# Patient Record
Sex: Female | Born: 1990 | Race: White | Hispanic: No | Marital: Single
Health system: Midwestern US, Community
[De-identification: ages and names within clinical notes are randomized; demographics above are authoritative.]

## PROBLEM LIST (undated history)

## (undated) DIAGNOSIS — G4730 Sleep apnea, unspecified: Secondary | ICD-10-CM

## (undated) DIAGNOSIS — F39 Unspecified mood [affective] disorder: Secondary | ICD-10-CM

## (undated) DIAGNOSIS — I10 Essential (primary) hypertension: Secondary | ICD-10-CM

## (undated) DIAGNOSIS — J45909 Unspecified asthma, uncomplicated: Secondary | ICD-10-CM

## (undated) DIAGNOSIS — F909 Attention-deficit hyperactivity disorder, unspecified type: Secondary | ICD-10-CM

## (undated) HISTORY — DX: Sleep apnea, unspecified: G47.30

## (undated) HISTORY — PX: NO PAST SURGERIES: SHX2092

---
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
## 2009-10-10 ENCOUNTER — Ambulatory Visit: Payer: Self-pay | Admitting: Family Medicine

## 2012-02-03 ENCOUNTER — Ambulatory Visit: Payer: Self-pay

## 2013-01-15 ENCOUNTER — Emergency Department: Payer: Self-pay | Admitting: Emergency Medicine

## 2013-06-15 ENCOUNTER — Ambulatory Visit: Payer: Self-pay

## 2015-02-02 DIAGNOSIS — R52 Pain, unspecified: Secondary | ICD-10-CM | POA: Insufficient documentation

## 2015-02-02 DIAGNOSIS — G4733 Obstructive sleep apnea (adult) (pediatric): Secondary | ICD-10-CM | POA: Insufficient documentation

## 2015-02-02 DIAGNOSIS — Z9989 Dependence on other enabling machines and devices: Secondary | ICD-10-CM

## 2015-02-09 ENCOUNTER — Emergency Department: Payer: Self-pay | Admitting: Internal Medicine

## 2015-02-20 ENCOUNTER — Emergency Department: Payer: Self-pay | Admitting: Emergency Medicine

## 2015-02-23 DIAGNOSIS — R070 Pain in throat: Secondary | ICD-10-CM | POA: Insufficient documentation

## 2015-08-31 ENCOUNTER — Encounter: Payer: Self-pay | Admitting: Emergency Medicine

## 2015-08-31 DIAGNOSIS — R509 Fever, unspecified: Secondary | ICD-10-CM | POA: Insufficient documentation

## 2015-08-31 DIAGNOSIS — H9191 Unspecified hearing loss, right ear: Secondary | ICD-10-CM | POA: Diagnosis present

## 2015-08-31 DIAGNOSIS — I10 Essential (primary) hypertension: Secondary | ICD-10-CM | POA: Diagnosis not present

## 2015-08-31 DIAGNOSIS — H6691 Otitis media, unspecified, right ear: Secondary | ICD-10-CM | POA: Insufficient documentation

## 2015-08-31 DIAGNOSIS — Z91040 Latex allergy status: Secondary | ICD-10-CM | POA: Insufficient documentation

## 2015-08-31 NOTE — ED Notes (Signed)
Pt has sinus infection she is currently taking Azo for sinus and is on day 4 tx.  She noted yesterday her hearing was fading out and today it is worse, she can't hear much of anything out of her right ear.Marland Kitchen

## 2015-09-01 ENCOUNTER — Emergency Department
Admission: EM | Admit: 2015-09-01 | Discharge: 2015-09-01 | Disposition: A | Discharge status: Home / Self Care | Payer: Medicaid Other | Attending: Emergency Medicine | Admitting: Emergency Medicine

## 2015-09-01 DIAGNOSIS — H6691 Otitis media, unspecified, right ear: Secondary | ICD-10-CM

## 2015-09-01 HISTORY — DX: Essential (primary) hypertension: I10

## 2015-09-01 MED ORDER — AMOXICILLIN 500 MG PO CAPS: 500.0000 mg | ORAL_CAPSULE | Freq: Three times a day (TID) | ORAL | Status: DC

## 2015-09-01 NOTE — ED Notes (Signed)

## 2015-09-01 NOTE — ED Provider Notes (Signed)
[facility] Emergency Department Provider Note    ____________________________________________  Time seen: 0105  I have reviewed the triage vital signs and the nursing notes.   HISTORY  Chief Complaint Hearing Loss   History limited by: Not Limited   HPI Pamela Benjamin is a 24 y.o. female who presents to the emergency department today because of concerns for right ear pain, hearing loss and discharge. The patient states that for the past couple of days she has been paddling a sinus infection. She states that she has been on azithromycin for this. She states it is not gotten any better. She states she has noticed a discharge from the right ear. She has also had pain from the right ear. It is kind of progressively worse and is been constant since yesterday. She states she has had decreased hearing in that same ear. She states she does have a history of recurrent ear infections.   Past Medical History  Diagnosis Date  . Hypertension     Propanolol 2 pills bid, pt unaware of dose    There are no active problems to display for this patient.   History reviewed. No pertinent past surgical history.  No current outpatient prescriptions on file.  Allergies Latex  History reviewed. No pertinent family history.  Social History Social History  Substance Use Topics  . Smoking status: Never Smoker   . Smokeless tobacco: None  . Alcohol Use: No    Review of Systems  Constitutional: Positive for fever. Cardiovascular: Negative for chest pain. Respiratory: Negative for shortness of breath. Gastrointestinal: Negative for abdominal pain, vomiting and diarrhea. Genitourinary: Negative for dysuria. Musculoskeletal: Negative for back pain. Skin: Negative for rash. Neurological: Negative for headaches, focal weakness or numbness.   10-point ROS otherwise negative.  ____________________________________________   PHYSICAL EXAM:  VITAL SIGNS: ED  Triage Vitals  Enc Vitals Group     BP 08/31/15 2032 133/84 mmHg     Pulse Rate 08/31/15 2032 89     Resp 08/31/15 2032 20     Temp 08/31/15 2032 98.3 F (36.8 C)     Temp Source 08/31/15 2032 Oral     SpO2 08/31/15 2032 97 %     Weight 08/31/15 2032 285 lb (129.275 kg)     Height 08/31/15 2032  (1.626 m)     Head Cir --      Peak Flow --      Pain Score 08/31/15 2033 10   Constitutional: Alert and oriented. Well appearing and in no distress. Eyes: Conjunctivae are normal. PERRL. Normal extraocular movements. ENT      Ears: Left TM wnl. Right TM with bulging, fluid behind. No obvious TM perforation. External canal clear.   Head: Normocephalic and atraumatic.   Nose: No congestion/rhinnorhea.   Mouth/Throat: Mucous membranes are moist.   Neck: No stridor. Hematological/Lymphatic/Immunilogical: No cervical lymphadenopathy. Cardiovascular: Normal rate, regular rhythm.  No murmurs, rubs, or gallops. Respiratory: Normal respiratory effort without tachypnea nor retractions. Breath sounds are clear and equal bilaterally. No wheezes/rales/rhonchi. Gastrointestinal: Soft and nontender. No distention.  Genitourinary: Deferred Musculoskeletal: Normal range of motion in all extremities. No joint effusions.  No lower extremity tenderness nor edema. Neurologic:  Normal speech and language. No gross focal neurologic deficits are appreciated. Speech is normal.  Skin:  Skin is warm, dry and intact. No rash noted. Psychiatric: Mood and affect are normal. Speech and behavior are normal. Patient exhibits appropriate insight and judgment.  ____________________________________________  LABS (pertinent positives/negatives)  None  ____________________________________________    EKG  None  ____________________________________________    RADIOLOGY  None   ____________________________________________   PROCEDURES  Procedure(s) performed: None  Critical Care  performed: No  ____________________________________________   INITIAL IMPRESSION / ASSESSMENT AND PLAN / ED COURSE  Pertinent labs & imaging results that were available during my care of the patient were reviewed by me and considered in my medical decision making (see chart for details).  Patient presents because of concerns for right ear pain, discharge and hearing loss. Exam is consistent with otitis media. Will discharge home on amoxicillin.  ____________________________________________   FINAL CLINICAL IMPRESSION(S) / ED DIAGNOSES  Final diagnoses:  Acute right otitis media, recurrence not specified, unspecified otitis media type     Phineas Semen, MD 09/01/15 [phone]

## 2015-09-01 NOTE — ED Notes (Signed)
Patient presents to ED with c/o right ear pain since yesterday, reports feeling worse this morning. Patient states she is having "ringing" in her ear and "I'm not even hearing out of it right now." Denies any known trauma to ear. (+) brown drainage. Reports is being treated for sinus infection with 250 mg Zithromax, is on day 4 of treatment, reports feels like medicine is not helping. Denies N/V/D, abdominal pain or chest pain. (+) congested cough. Patient alert and oriented x 4, respirations even and unlabored, skin warm and dry, call bell within reach.

## 2015-09-01 NOTE — Discharge Instructions (Signed)
Please seek medical attention for any high fevers, chest pain, shortness of breath, change in behavior, persistent vomiting, bloody stool or any other new or concerning symptoms.   Otitis Media, Adult Otitis media is redness, soreness, and puffiness (swelling) in the space just behind your eardrum (middle ear). It may be caused by allergies or infection. It often happens along with a cold. HOME CARE  Take your medicine as told. Finish it even if you start to feel better.  Only take over-the-counter or prescription medicines for pain, discomfort, or fever as told by your doctor.  Follow up with your doctor as told. GET HELP IF:  You have otitis media only in one ear, or bleeding from your nose, or both.  You notice a lump on your neck.  You are not getting better in 3-5 days.  You feel worse instead of better. GET HELP RIGHT AWAY IF:   You have pain that is not helped with medicine.  You have puffiness, redness, or pain around your ear.  You get a stiff neck.  You cannot move part of your face (paralysis).  You notice that the bone behind your ear hurts when you touch it. MAKE SURE YOU:   Understand these instructions.  Will watch your condition.  Will get help right away if you are not doing well or get worse.   This information is not intended to replace advice given to you by your health care provider. Make sure you discuss any questions you have with your health care provider.   Document Released: 04/29/2008 Document Revised: 12/02/2014 Document Reviewed: 06/08/2013 Elsevier Interactive Patient Education [employer]

## 2015-10-13 ENCOUNTER — Ambulatory Visit: Payer: Medicaid Other | Attending: Neurology

## 2015-10-13 DIAGNOSIS — G4733 Obstructive sleep apnea (adult) (pediatric): Secondary | ICD-10-CM | POA: Diagnosis present

## 2015-11-02 ENCOUNTER — Other Ambulatory Visit: Payer: Self-pay | Admitting: Physician Assistant

## 2015-11-02 DIAGNOSIS — J0101 Acute recurrent maxillary sinusitis: Secondary | ICD-10-CM

## 2015-11-06 ENCOUNTER — Ambulatory Visit: Payer: Medicaid Other

## 2015-11-10 ENCOUNTER — Ambulatory Visit
Admission: RE | Admit: 2015-11-10 | Discharge: 2015-11-10 | Disposition: A | Discharge status: Home / Self Care | Payer: Medicaid Other | Source: Ambulatory Visit | Attending: Physician Assistant | Admitting: Physician Assistant

## 2015-11-10 DIAGNOSIS — J0101 Acute recurrent maxillary sinusitis: Secondary | ICD-10-CM | POA: Diagnosis present

## 2016-01-07 ENCOUNTER — Emergency Department
Admission: EM | Admit: 2016-01-07 | Discharge: 2016-01-07 | Disposition: A | Discharge status: Home / Self Care | Payer: Medicaid Other | Attending: Emergency Medicine | Admitting: Emergency Medicine

## 2016-01-07 ENCOUNTER — Encounter: Payer: Self-pay | Admitting: Emergency Medicine

## 2016-01-07 DIAGNOSIS — Y9289 Other specified places as the place of occurrence of the external cause: Secondary | ICD-10-CM | POA: Insufficient documentation

## 2016-01-07 DIAGNOSIS — Z23 Encounter for immunization: Secondary | ICD-10-CM | POA: Diagnosis not present

## 2016-01-07 DIAGNOSIS — Y9389 Activity, other specified: Secondary | ICD-10-CM | POA: Insufficient documentation

## 2016-01-07 DIAGNOSIS — S61216A Laceration without foreign body of right little finger without damage to nail, initial encounter: Secondary | ICD-10-CM | POA: Insufficient documentation

## 2016-01-07 DIAGNOSIS — Z91040 Latex allergy status: Secondary | ICD-10-CM | POA: Insufficient documentation

## 2016-01-07 DIAGNOSIS — W268XXA Contact with other sharp object(s), not elsewhere classified, initial encounter: Secondary | ICD-10-CM | POA: Insufficient documentation

## 2016-01-07 DIAGNOSIS — Y998 Other external cause status: Secondary | ICD-10-CM | POA: Diagnosis not present

## 2016-01-07 DIAGNOSIS — I10 Essential (primary) hypertension: Secondary | ICD-10-CM | POA: Diagnosis not present

## 2016-01-07 DIAGNOSIS — S61210A Laceration without foreign body of right index finger without damage to nail, initial encounter: Secondary | ICD-10-CM

## 2016-01-07 DIAGNOSIS — Z792 Long term (current) use of antibiotics: Secondary | ICD-10-CM | POA: Diagnosis not present

## 2016-01-07 HISTORY — DX: Unspecified mood (affective) disorder: F39

## 2016-01-07 HISTORY — DX: Attention-deficit hyperactivity disorder, unspecified type: F90.9

## 2016-01-07 HISTORY — DX: Unspecified asthma, uncomplicated: J45.909

## 2016-01-07 MED ORDER — TETANUS-DIPHTH-ACELL PERTUSSIS 5-2.5-18.5 LF-MCG/0.5 IM SUSP
0.5000 mL | Freq: Once | INTRAMUSCULAR | Status: AC
  Administered 2016-01-07: 0.5 mL via INTRAMUSCULAR
  Filled 2016-01-07: qty 0.5

## 2016-01-07 MED ORDER — BACITRACIN ZINC 500 UNIT/GM EX OINT
TOPICAL_OINTMENT | CUTANEOUS | Status: AC
  Filled 2016-01-07: qty 0.9

## 2016-01-07 NOTE — ED Notes (Signed)
Pt states around the 4 o'clock hour, patient states her neighbor took a broom out of her hand that had metal spikes on it and cut her 5th digit on the right hand.  Bleeding is controlled right now with bandage from home. NAD at this time.

## 2016-01-07 NOTE — Discharge Instructions (Signed)
Nonsutured Laceration Care °A laceration is a cut that goes through all layers of the skin and extends into the tissue that is right under the skin. This type of cut is usually stitched up (sutured) or closed with tape (adhesive strips) or skin glue shortly after the injury happens. °However, if the wound is dirty or if several hours pass before medical treatment is provided, it is likely that germs (bacteria) will enter the wound. Closing a laceration after bacteria have entered it increases the risk of infection. In these cases, your health care provider may leave the laceration open (nonsutured) and cover it with a bandage. This type of treatment helps prevent infection and allows the wound to heal from the deepest layer of tissue damage up to the surface. °An open fracture is a type of injury that may involve nonsutured lacerations. An open fracture is a break in a bone that happens along with one or more lacerations through the skin that is near the fracture site. °HOW TO CARE FOR YOUR NONSUTURED LACERATION °· Take or apply over-the-counter and prescription medicines only as told by your health care provider. °· If you were prescribed an antibiotic medicine, take or apply it as told by your health care provider. Do not stop using the antibiotic even if your condition improves. °· Clean the wound one time each day or as told by your health care provider. °¨ Wash the wound with mild soap and water. °¨ Rinse the wound with water to remove all soap. °¨ Pat your wound dry with a clean towel. Do not rub the wound. °· Do not inject anything into the wound unless your health care provider told you to. °· Change any bandages (dressings) as told by your health care provider. This includes changing the dressing if it gets wet, dirty, or starts to smell bad. °· Keep the dressing dry until your health care provider says it can be removed. Do not take baths, swim, or do anything that puts your wound underwater until your  health care provider approves. °· Raise (elevate) the injured area above the level of your heart while you are sitting or lying down, if possible. °· Do not scratch or pick at the wound. °· Check your wound every day for signs of infection. Watch for: °¨ Redness, swelling, or pain. °¨ Fluid, blood, or pus. °· Keep all follow-up visits as told by your health care provider. This is important. °SEEK MEDICAL CARE IF: °· You received a tetanus and shot and you have swelling, severe pain, redness, or bleeding at the injection site.   °· You have a fever. °· Your pain is not controlled with medicine. °· You have increased redness, swelling, or pain at the site of your wound. °· You have fluid, blood, or pus coming from your wound. °· You notice a bad smell coming from your wound or your dressing. °· You notice something coming out of the wound, such as wood or glass. °· You notice a change in the color of your skin near your wound. °· You develop a new rash. °· You need to change the dressing frequently due to fluid, blood, or pus draining from the wound. °· You develop numbness around your wound. °SEEK IMMEDIATE MEDICAL CARE IF: °· Your pain suddenly increases and is severe. °· You develop severe swelling around the wound. °· The wound is on your hand or foot and you cannot properly move a finger or toe. °· The wound is on your hand or   foot and you notice that your fingers or toes look pale or bluish.  You have a red streak going away from your wound.   This information is not intended to replace advice given to you by your health care provider. Make sure you discuss any questions you have with your health care provider.   Document Released: 10/09/2006 Document Revised: 03/28/2015 Document Reviewed: 11/07/2014 Elsevier Interactive Patient Education 2016 [employer] Inc[employer]   WOUND CARE    Keep area clean and dry for 24 hours. Do not remove bandage, if applied.  After 24 hours, remove bandage and wash  wound gently with mild soap and warm water. Reapply a new bandage after cleaning wound, if directed.  Continue daily cleansing with soap and water until stitches/staples are removed.  Do not apply any ointments or creams to the wound while stitches/staples are in place, as this may cause delayed healing.  Notify the office if you experience any of the following signs of infection: Swelling, redness, pus drainage, streaking, fever >101.0 F  Notify the office if you experience excessive bleeding that does not stop after 15-20 minutes of constant, firm pressure.

## 2016-01-07 NOTE — ED Provider Notes (Signed)
[facility] Emergency Department Provider Note  ____________________________________________  Time seen: Approximately 5:42 PM  I have reviewed the triage vital signs and the nursing notes.   HISTORY  Chief Complaint Laceration   HPI Pamela Benjamin is a 25 y.o. female is here with complaint of laceration to her right fifth finger when she took a broom out of her neighbors hand that had metal spikes on it. Patient states that this occurred approximately 4 PM. Bleeding has been controlled. Patient has not had a tetanus booster. Currently she rates her pain as 9 out of 10.   Past Medical History  Diagnosis Date  . Hypertension     Propanolol 2 pills bid, pt unaware of dose  . Mood disorder (HCC)   . ADHD (attention deficit hyperactivity disorder)   . Asthma     There are no active problems to display for this patient.   History reviewed. No pertinent past surgical history.  Current Outpatient Rx  Name  Route  Sig  Dispense  Refill  . amoxicillin (AMOXIL) 500 MG capsule   Oral   Take 1 capsule (500 mg total) by mouth 3 (three) times daily.   30 capsule   0     Allergies Latex  History reviewed. No pertinent family history.  Social History Social History  Substance Use Topics  . Smoking status: Never Smoker   . Smokeless tobacco: None  . Alcohol Use: No    Review of Systems Constitutional: No fever/chills Cardiovascular: Denies chest pain. Respiratory: Denies shortness of breath. Gastrointestinal:   No nausea, no vomiting.  Skin: Positive for laceration Neurological: Negative for headaches, focal weakness or numbness.  10-point ROS otherwise negative.  ____________________________________________   PHYSICAL EXAM:  VITAL SIGNS: ED Triage Vitals  Enc Vitals Group     BP 01/07/16 1735 148/76 mmHg     Pulse Rate 01/07/16 1735 95     Resp 01/07/16 1735 16     Temp 01/07/16 1735 97.7 F (36.5 C)     Temp Source 01/07/16 1735  Oral     SpO2 01/07/16 1735 100 %     Weight 01/07/16 1735 286 lb (129.729 kg)     Height 01/07/16 1735  (1.626 m)     Head Cir --      Peak Flow --      Pain Score 01/07/16 1736 9     Pain Loc --      Pain Edu? --      Excl. in GC? --     Constitutional: Alert and oriented. Well appearing and in no acute distress. Eyes: Conjunctivae are normal. PERRL. EOMI. Head: Atraumatic. Nose: No congestion/rhinnorhea. Neck: No stridor.   Cardiovascular: Normal rate, regular rhythm. Grossly normal heart sounds.  Good peripheral circulation. Respiratory: Normal respiratory effort.  No retractions. Lungs CTAB. Musculoskeletal: Moves upper and lower extremities without any difficulty. Motor sensory function intact. Right hand range of motion is within normal limits the patient is able to make a fist and flex and extend the fifth digit without any difficulty. Neurologic:  Normal speech and language. No gross focal neurologic deficits are appreciated. No gait instability. Skin:  Skin is warm, dry and intact. Patient has 3 individual superficial lacerations each less than 0.2 cm. Around the right fifth finger. There is no active bleeding at present. Psychiatric: Mood and affect are normal. Speech and behavior are normal.  ____________________________________________   LABS (all labs ordered are listed, but only abnormal results  are displayed)  Labs Reviewed - No data to display   PROCEDURES  Procedure(s) performed: None  Critical Care performed: No  ____________________________________________   INITIAL IMPRESSION / ASSESSMENT AND PLAN / ED COURSE  Pertinent labs & imaging results that were available during my care of the patient were reviewed by me and considered in my medical decision making (see chart for details).  Area was cleaned and dressing was placed. Patient is to clean the area twice a day with warm soapy water. Patient is watch for signs of infection. She is to follow-up  with Dr. Welton Flakes at [facility] if any continued problems. ____________________________________________   FINAL CLINICAL IMPRESSION(S) / ED DIAGNOSES  Final diagnoses:  Laceration of right index finger w/o foreign body w/o damage to nail, initial encounter      Tommi Rumps, PA-C 01/07/16 1826  Myrna Blazer, MD 01/07/16 [phone]

## 2016-07-01 DIAGNOSIS — G44229 Chronic tension-type headache, not intractable: Secondary | ICD-10-CM | POA: Insufficient documentation

## 2016-08-11 ENCOUNTER — Emergency Department
Admission: EM | Admit: 2016-08-11 | Discharge: 2016-08-11 | Disposition: A | Discharge status: Home / Self Care | Payer: Medicaid Other | Attending: Emergency Medicine | Admitting: Emergency Medicine

## 2016-08-11 ENCOUNTER — Emergency Department: Payer: Medicaid Other

## 2016-08-11 ENCOUNTER — Encounter: Payer: Self-pay | Admitting: Emergency Medicine

## 2016-08-11 DIAGNOSIS — R109 Unspecified abdominal pain: Secondary | ICD-10-CM

## 2016-08-11 DIAGNOSIS — R112 Nausea with vomiting, unspecified: Secondary | ICD-10-CM

## 2016-08-11 DIAGNOSIS — R197 Diarrhea, unspecified: Secondary | ICD-10-CM | POA: Insufficient documentation

## 2016-08-11 DIAGNOSIS — I10 Essential (primary) hypertension: Secondary | ICD-10-CM | POA: Diagnosis not present

## 2016-08-11 DIAGNOSIS — J45909 Unspecified asthma, uncomplicated: Secondary | ICD-10-CM | POA: Diagnosis not present

## 2016-08-11 DIAGNOSIS — F909 Attention-deficit hyperactivity disorder, unspecified type: Secondary | ICD-10-CM | POA: Diagnosis not present

## 2016-08-11 LAB — COMPREHENSIVE METABOLIC PANEL
ALK PHOS: 96 U/L (ref 38–126)
ALT: 21 U/L (ref 14–54)
AST: 18 U/L (ref 15–41)
Albumin: 4.2 g/dL (ref 3.5–5.0)
Anion gap: 8 (ref 5–15)
BILIRUBIN TOTAL: 0.4 mg/dL (ref 0.3–1.2)
BUN: 10 mg/dL (ref 6–20)
CALCIUM: 9.5 mg/dL (ref 8.9–10.3)
CHLORIDE: 107 mmol/L (ref 101–111)
CO2: 23 mmol/L (ref 22–32)
CREATININE: 0.86 mg/dL (ref 0.44–1.00)
Glucose, Bld: 110 mg/dL — ABNORMAL HIGH (ref 65–99)
Potassium: 3.9 mmol/L (ref 3.5–5.1)
Sodium: 138 mmol/L (ref 135–145)
TOTAL PROTEIN: 7.5 g/dL (ref 6.5–8.1)

## 2016-08-11 LAB — CBC
HCT: 43.7 % (ref 35.0–47.0)
Hemoglobin: 15.5 g/dL (ref 12.0–16.0)
MCH: 32.1 pg (ref 26.0–34.0)
MCHC: 35.5 g/dL (ref 32.0–36.0)
MCV: 90.5 fL (ref 80.0–100.0)
PLATELETS: 308 10*3/uL (ref 150–440)
RBC: 4.84 MIL/uL (ref 3.80–5.20)
RDW: 12.3 % (ref 11.5–14.5)
WBC: 9.9 10*3/uL (ref 3.6–11.0)

## 2016-08-11 LAB — URINALYSIS COMPLETE WITH MICROSCOPIC (ARMC ONLY)
BILIRUBIN URINE: NEGATIVE
Glucose, UA: NEGATIVE mg/dL
Hgb urine dipstick: NEGATIVE
KETONES UR: NEGATIVE mg/dL
LEUKOCYTES UA: NEGATIVE
Nitrite: NEGATIVE
PH: 6 (ref 5.0–8.0)
Protein, ur: NEGATIVE mg/dL
RBC / HPF: NONE SEEN RBC/hpf (ref 0–5)
SPECIFIC GRAVITY, URINE: 1.002 — AB (ref 1.005–1.030)

## 2016-08-11 LAB — POCT PREGNANCY, URINE: Preg Test, Ur: NEGATIVE

## 2016-08-11 LAB — LIPASE, BLOOD: Lipase: 20 U/L (ref 11–51)

## 2016-08-11 MED ORDER — ONDANSETRON HCL 4 MG/2ML IJ SOLN
4.0000 mg | Freq: Once | INTRAMUSCULAR | Status: AC
  Administered 2016-08-11: 4 mg via INTRAVENOUS
  Filled 2016-08-11: qty 2

## 2016-08-11 MED ORDER — SODIUM CHLORIDE 0.9 % IV BOLUS (SEPSIS)
1000.0000 mL | Freq: Once | INTRAVENOUS | Status: AC
  Administered 2016-08-11: 1000 mL via INTRAVENOUS

## 2016-08-11 MED ORDER — ONDANSETRON HCL 4 MG PO TABS: 4.0000 mg | ORAL_TABLET | Freq: Three times a day (TID) | ORAL | 0 refills | Status: DC | PRN

## 2016-08-11 NOTE — ED Triage Notes (Signed)
"  My gallbladder is making me sick".  C/O abdominal pain, vomiting, diarrhea x 1 day.

## 2016-08-11 NOTE — ED Notes (Signed)
See triage note. Pt states she has had intermittent RLQ and LLQ pain since yesterday. Pt states that a sharp pain will wake her up at night and that is how she knows it's her gallbladder. No hx gallbladder problems.

## 2016-08-11 NOTE — ED Provider Notes (Signed)
[facility] Emergency Department Provider Note  ____________________________________________   I have reviewed the triage vital signs and the nursing notes.   HISTORY  Chief Complaint Abdominal Pain and Emesis    HPI Pamela Benjamin is a 25 y.o. female who presents today complaining of nausea vomiting and diarrhea for a few days. She complains also of a cramping abdominal discomfort associated with diarrhea. The diarrhea is nonbloody and non-melanotic. She's had no recent travel. The vomiting is nonbloody and nonbilious. She states that she has had no recent antibiotics and no recent camping. She is concerned that she has gallbladder disease because sometimes the pain is in the right upper quadrant. She also has pain in the left lower quadrant sometimes in the right lower quadrant neck shows sometimes also in the left upper quadrant. At this time she is not complaining of pain at all. She states that it comes and goes. She feels that this is likely due to her gallbladder but she also expresses uncertainty about where her gallbladder is. She has no history of gallbladder disease or other abdominal surgery. She does not have any dysuria or urinary frequency and she denies pregnancy. The pain, when it comes, it is a cramping fleeting discomfort. Often relieved by diarrhea. Nothing else makes it worse or better.      Past Medical History:  Diagnosis Date  . ADHD (attention deficit hyperactivity disorder)   . Asthma   . Hypertension    Propanolol 2 pills bid, pt unaware of dose  . Mood disorder (HCC)     There are no active problems to display for this patient.   No past surgical history on file.  Prior to Admission medications   Medication Sig Start Date End Date Taking? Authorizing Provider  amoxicillin (AMOXIL) 500 MG capsule Take 1 capsule (500 mg total) by mouth 3 (three) times daily. 09/01/15   Phineas SemenGraydon Goodman, MD    Allergies Latex  No family  history on file.  Social History Social History  Substance Use Topics  . Smoking status: Never Smoker  . Smokeless tobacco: Never Used  . Alcohol use No    Review of Systems Constitutional: No fever/chills Eyes: No visual changes. ENT: No sore throat. No stiff neck no neck pain Cardiovascular: Denies chest pain. Respiratory: Denies shortness of breath. Gastrointestinal:  See history of present illness Genitourinary: Negative for dysuria. Musculoskeletal: Negative lower extremity swelling Skin: Negative for rash. Neurological: Negative for severe headaches, focal weakness or numbness. 10-point ROS otherwise negative.  ____________________________________________   PHYSICAL EXAM:  VITAL SIGNS: ED Triage Vitals  Enc Vitals Group     BP 08/11/16 1416 (!) 151/85     Pulse Rate 08/11/16 1416 78     Resp 08/11/16 1416 16     Temp 08/11/16 1416 98 F (36.7 C)     Temp Source 08/11/16 1416 Oral     SpO2 08/11/16 1416 97 %     Weight 08/11/16 1417 (!) 314 lb (142.4 kg)     Height 08/11/16 1417 5\' 4"  (1.626 m)     Head Circumference --      Peak Flow --      Pain Score 08/11/16 1414 10     Pain Loc --      Pain Edu? --      Excl. in GC? --     Constitutional: Alert and oriented. Well appearing and in no acute distress. Eyes: Conjunctivae are normal. PERRL. EOMI. Head: Atraumatic. Nose: No  congestion/rhinnorhea. Mouth/Throat: Mucous membranes are moist.  Oropharynx non-erythematous. Neck: No stridor.   Nontender with no meningismus Cardiovascular: Normal rate, regular rhythm. Grossly normal heart sounds.  Good peripheral circulation. Respiratory: Normal respiratory effort.  No retractions. Lungs CTAB. Abdominal:Morbidly obese, no focal tenderness noted aside from minimal epigastric discomfort. [place], no tenderness palpation to deep palpation of the right lower quadrant or left abdomin Back:  There is no focal tenderness or step off.  there is no midline tenderness there  are no lesions noted. there is no CVA tenderness Musculoskeletal: No lower extremity tenderness, no upper extremity tenderness. No joint effusions, no DVT signs strong distal pulses no edema Neurologic:  Normal speech and language. No gross focal neurologic deficits are appreciated.  Skin:  Skin is warm, dry and intact. No rash noted. Psychiatric: Mood and affect are normal. Speech and behavior are normal.  ____________________________________________   LABS (all labs ordered are listed, but only abnormal results are displayed)  Labs Reviewed  COMPREHENSIVE METABOLIC PANEL - Abnormal; Notable for the following:       Result Value   Glucose, Bld 110 (*)    All other components within normal limits  LIPASE, BLOOD  CBC  URINALYSIS COMPLETEWITH MICROSCOPIC ([facility] ONLY)  POC URINE PREG, ED   ____________________________________________  EKG  I personally interpreted any EKGs ordered by me or triage  ____________________________________________  RADIOLOGY  I reviewed any imaging ordered by me or triage that were performed during my shift and, if possible, patient and/or family made aware of any abnormal findings. ____________________________________________   PROCEDURES  Procedure(s) performed: None  Procedures  Critical Care performed: None  ____________________________________________   INITIAL IMPRESSION / ASSESSMENT AND PLAN / ED COURSE  Pertinent labs & imaging results that were available during my care of the patient were reviewed by me and considered in my medical decision making (see chart for details).  Patient with nausea vomiting and diarrhea for few days, watery diarrhea. Most likely this is a viral pathology. She is not vomiting here. She is actually requesting food and water. She is concerned about gallbladder pathology. Due to morbid obesity and my exam is somewhat limited but I have low suspicion. We did do an ultrasound however because of her morbid  obesity because it does limit my ability to determine acute pathology in her abdomen and ultrasound is as expected normal. At this time, there is nothing to indicate appendicitis no fever no white count no focal right lower quadrant pain, nothing to suggest ovarian torsion or ovarian cyst abdominal obstruction volvulus or other such pathology. Patient is well-appearing and in no acute distress. She is using her telephone and laughing. Serial abdominal exams are benign. I am relieved that there is no evidence of acute surgical pathology at this time and I have given her extensive return precautions and follow-up instructions.  Clinical Course   ____________________________________________   FINAL CLINICAL IMPRESSION(S) / ED DIAGNOSES  Final diagnoses:  Abdominal pain      This chart was dictated using voice recognition software.  Despite best efforts to proofread,  errors can occur which can change meaning.      Jeanmarie Plant, MD 08/11/16 [phone]

## 2016-08-11 NOTE — ED Notes (Signed)

## 2016-08-29 ENCOUNTER — Other Ambulatory Visit: Payer: Self-pay | Admitting: Unknown Physician Specialty

## 2016-08-29 DIAGNOSIS — H9211 Otorrhea, right ear: Secondary | ICD-10-CM

## 2016-09-09 ENCOUNTER — Ambulatory Visit
Admission: RE | Admit: 2016-09-09 | Discharge: 2016-09-09 | Disposition: A | Discharge status: Home / Self Care | Payer: Medicaid Other | Source: Ambulatory Visit | Attending: Unknown Physician Specialty | Admitting: Unknown Physician Specialty

## 2016-09-09 DIAGNOSIS — J0130 Acute sphenoidal sinusitis, unspecified: Secondary | ICD-10-CM | POA: Insufficient documentation

## 2016-09-09 DIAGNOSIS — H9211 Otorrhea, right ear: Secondary | ICD-10-CM

## 2016-09-09 DIAGNOSIS — H748X3 Other specified disorders of middle ear and mastoid, bilateral: Secondary | ICD-10-CM | POA: Diagnosis not present

## 2016-09-09 DIAGNOSIS — H9210 Otorrhea, unspecified ear: Secondary | ICD-10-CM | POA: Diagnosis present

## 2017-01-20 ENCOUNTER — Other Ambulatory Visit: Payer: Self-pay | Admitting: Nurse Practitioner

## 2017-01-20 DIAGNOSIS — K219 Gastro-esophageal reflux disease without esophagitis: Secondary | ICD-10-CM

## 2017-01-28 ENCOUNTER — Ambulatory Visit
Admission: RE | Admit: 2017-01-28 | Discharge: 2017-01-28 | Disposition: A | Discharge status: Home / Self Care | Payer: Medicaid Other | Source: Ambulatory Visit | Attending: Nurse Practitioner | Admitting: Nurse Practitioner

## 2017-01-28 DIAGNOSIS — K449 Diaphragmatic hernia without obstruction or gangrene: Secondary | ICD-10-CM | POA: Diagnosis not present

## 2017-01-28 DIAGNOSIS — K219 Gastro-esophageal reflux disease without esophagitis: Secondary | ICD-10-CM | POA: Diagnosis present

## 2017-03-06 ENCOUNTER — Encounter: Payer: Self-pay | Admitting: Gastroenterology

## 2017-03-06 ENCOUNTER — Encounter (INDEPENDENT_AMBULATORY_CARE_PROVIDER_SITE_OTHER): Payer: Self-pay

## 2017-03-06 ENCOUNTER — Ambulatory Visit (INDEPENDENT_AMBULATORY_CARE_PROVIDER_SITE_OTHER): Payer: Medicaid Other | Admitting: Gastroenterology

## 2017-03-06 VITALS — BP 130/85 | HR 89 | Temp 97.7°F | Ht 64.0 in | Wt 325.4 lb

## 2017-03-06 DIAGNOSIS — R110 Nausea: Secondary | ICD-10-CM | POA: Diagnosis not present

## 2017-03-06 DIAGNOSIS — R932 Abnormal findings on diagnostic imaging of liver and biliary tract: Secondary | ICD-10-CM | POA: Diagnosis not present

## 2017-03-06 MED ORDER — OMEPRAZOLE 40 MG PO CPDR: 40.0000 mg | DELAYED_RELEASE_CAPSULE | Freq: Every day | ORAL | 3 refills | Status: DC

## 2017-03-06 NOTE — Progress Notes (Signed)
Gastroenterology Consultation  Referring Provider:     Lyndon Code, MD Primary Care Physician:  Lyndon Code, MD Primary Gastroenterologist:  Dr. Wyline Mood  Reason for Consultation:     Hepatomegaly, abdominal pain and vomiting        HPI:   Pamela Benjamin is a 26 y.o. y/o female referred for consultation & management  by Dr. Welton Flakes, Shannan Harper, MD  She has been referred for an enlarged liver and abdominal pain.I do not have access to the ultrasound report that mentions hepatomegaly. She says she has a fatty liver.   RUQ USG 07/2016-normal size of liver UGI with SBFT 01/2017 showed hiatal hernia, reflux otherwise normal.   She says that she cannot hold any food down for the past 3 months, she says she eats the food and almost immediately she is ruuning to the rest room to either throw up or have a bowel movement .  She does actually throw it up , She does have some heartburn. Occasional headaches. No exposure to marijuana. Affects all meals. Takes only tylenol, no issues with balance, no issues with her vision . She says that her right hearing is impaired and was given " yellow powder" which she says helps.   When she throws up , its only food, undigested. Denies the sensation of food sitting in her stomach for a long time. Abdominal pain is not really an issue.   She has been started on Reglan but is unsure if she takes it. In general she is under a lot of stress. She denies any nausea.  When she has a meal -says it "is a medium size",   Gained 11 lbs last 6 months.   BP 130/85 (BP Location: Left Arm, Patient Position: Sitting, Cuff Size: Large)   Pulse 89   Temp 97.7 F (36.5 C) (Oral)   Ht  (1.626 m)   Wt (!) 325 lb 6.4 oz (147.6 kg)   BMI 55.85 kg/m    Past Medical History:  Diagnosis Date  . ADHD (attention deficit hyperactivity disorder)   . Asthma   . Hypertension    Propanolol 2 pills bid, pt unaware of dose  . Mood disorder (HCC)     No past surgical  history on file.  Prior to Admission medications   Medication Sig Start Date End Date Taking? Authorizing Provider  methylphenidate (RITALIN) 10 MG tablet Take 10 mg by mouth every morning.   Yes Historical Provider, MD  methylphenidate 36 MG PO CR tablet Take by mouth.   Yes Historical Provider, MD  ondansetron (ZOFRAN) 4 MG tablet Take 1 tablet (4 mg total) by mouth every 8 (eight) hours as needed for nausea or vomiting. 08/11/16  Yes Jeanmarie Plant, MD  oxcarbazepine (TRILEPTAL) 600 MG tablet Take by mouth.   Yes Historical Provider, MD  propranolol (INDERAL) 10 MG tablet Take by mouth.   Yes Historical Provider, MD  topiramate (TOPAMAX) 100 MG tablet Take 1/2 tab in the morning and 1 1/2 tab at night 04/17/16  Yes Historical Provider, MD    No family history on file.   Social History  Substance Use Topics  . Smoking status: Never Smoker  . Smokeless tobacco: Never Used  . Alcohol use No    Allergies as of 03/06/2017 - Review Complete 03/06/2017  Allergen Reaction Noted  . Other Other (See Comments) 07/01/2016  . Pollen extract Other (See Comments) 07/01/2016  . Latex Rash 02/02/2015  Review of Systems:    All systems reviewed and negative except where noted in HPI.   Physical Exam:  BP 130/85 (BP Location: Left Arm, Patient Position: Sitting, Cuff Size: Large)   Pulse 89   Temp 97.7 F (36.5 C) (Oral)   Ht  (1.626 m)   Wt (!) 325 lb 6.4 oz (147.6 kg)   BMI 55.85 kg/m  No LMP recorded. Patient has had an injection. Psych:  Alert and cooperative. Normal mood and affect. General:   Alert,  Well-developed, well-nourished, pleasant and cooperative in NAD, morbidly obese Head:  Normocephalic and atraumatic. Eyes:  Sclera clear, no icterus.   Conjunctiva pink. Ears:  Normal auditory acuity. Nose:  No deformity, discharge, or lesions. Mouth:  No deformity or lesions,oropharynx pink & moist. Neck:  Supple; no masses or thyromegaly. Lungs:  Respirations even and  unlabored.  Clear throughout to auscultation.   No wheezes, crackles, or rhonchi. No acute distress. Heart:  Regular rate and rhythm; no murmurs, clicks, rubs, or gallops. Abdomen:  Normal bowel sounds.  No bruits.  Soft, non-tender and non-distended without masses, hepatosplenomegaly or hernias noted.  No guarding or rebound tenderness.   . Neurologic:  Alert and oriented x3;  grossly normal neurologically. Skin:  Intact without significant lesions or rashes. No jaundice. Lymph Nodes:  No significant cervical adenopathy. Psych:  Alert and cooperative. Normal mood and affect.  Imaging Studies: No results found.  Assessment and Plan:   Pamela Benjamin is a 26 y.o. y/o female has been referred for hepatomegaly - it is very likely secondary to fatty liver. I will obtain the report and discuss further at next visit. Her vomiting is probably functional vs due to reflux or from delayed gastric emptying.    Plan  1. Get recent USG report 2. CBC,BMP,LFT,TSH 3. Omeprazole daily. 4. Gastric emptying study followed by EGD. EGD risks of sedation are high due to BMI and possible OSA 5. Suggested to lose weight as reflux may be the main issue.   Counseled on life style changes, suggest to use PPI first thing in the morning on empty stomach and eat 30 minutes after. Advised on the use of a wedge pillow at night , avoid meals for 2 hours prior to bed time. Weight loss    Follow up in 6-8 weeks.   Dr Wyline Mood MD

## 2017-03-07 ENCOUNTER — Other Ambulatory Visit: Payer: Self-pay

## 2017-03-07 DIAGNOSIS — R197 Diarrhea, unspecified: Secondary | ICD-10-CM

## 2017-03-18 ENCOUNTER — Telehealth: Payer: Self-pay

## 2017-03-18 NOTE — Telephone Encounter (Signed)
Called to advise patient of gastric emptyng study scheduled for 03/24/17 @ [facility] [place]. NPO 8 hrs. Stop PPI's.   Unable to leave vm for patient. Called x2

## 2017-03-24 ENCOUNTER — Ambulatory Visit: Admission: RE | Admit: 2017-03-24 | Payer: Medicaid Other | Source: Ambulatory Visit

## 2017-04-17 ENCOUNTER — Telehealth: Payer: Self-pay

## 2017-04-17 ENCOUNTER — Ambulatory Visit (INDEPENDENT_AMBULATORY_CARE_PROVIDER_SITE_OTHER): Payer: Medicaid Other | Admitting: Gastroenterology

## 2017-04-17 ENCOUNTER — Other Ambulatory Visit
Admission: RE | Admit: 2017-04-17 | Discharge: 2017-04-17 | Disposition: A | Discharge status: Home / Self Care | Payer: Medicaid Other | Source: Ambulatory Visit | Attending: Gastroenterology | Admitting: Gastroenterology

## 2017-04-17 ENCOUNTER — Encounter: Payer: Self-pay | Admitting: Gastroenterology

## 2017-04-17 VITALS — BP 135/78 | HR 80 | Temp 98.1°F | Ht 64.0 in | Wt 318.8 lb

## 2017-04-17 DIAGNOSIS — Z3202 Encounter for pregnancy test, result negative: Secondary | ICD-10-CM | POA: Insufficient documentation

## 2017-04-17 DIAGNOSIS — R110 Nausea: Secondary | ICD-10-CM | POA: Diagnosis not present

## 2017-04-17 LAB — CBC WITH DIFFERENTIAL/PLATELET
BASOS ABS: 0.1 10*3/uL (ref 0–0.1)
Basophils Relative: 1 %
Eosinophils Absolute: 0.3 10*3/uL (ref 0–0.7)
Eosinophils Relative: 4 %
HCT: 43.6 % (ref 35.0–47.0)
HEMOGLOBIN: 15.2 g/dL (ref 12.0–16.0)
LYMPHS ABS: 3.8 10*3/uL — AB (ref 1.0–3.6)
LYMPHS PCT: 44 %
MCH: 31.2 pg (ref 26.0–34.0)
MCHC: 34.9 g/dL (ref 32.0–36.0)
MCV: 89.3 fL (ref 80.0–100.0)
Monocytes Absolute: 0.4 10*3/uL (ref 0.2–0.9)
Monocytes Relative: 5 %
Neutro Abs: 4.2 10*3/uL (ref 1.4–6.5)
Neutrophils Relative %: 46 %
Platelets: 373 10*3/uL (ref 150–440)
RBC: 4.89 MIL/uL (ref 3.80–5.20)
RDW: 12.6 % (ref 11.5–14.5)
WBC: 8.8 10*3/uL (ref 3.6–11.0)

## 2017-04-17 LAB — COMPREHENSIVE METABOLIC PANEL
ALK PHOS: 104 U/L (ref 38–126)
ALT: 25 U/L (ref 14–54)
AST: 19 U/L (ref 15–41)
Albumin: 4.3 g/dL (ref 3.5–5.0)
Anion gap: 8 (ref 5–15)
BUN: 13 mg/dL (ref 6–20)
CALCIUM: 9.8 mg/dL (ref 8.9–10.3)
CHLORIDE: 108 mmol/L (ref 101–111)
CO2: 21 mmol/L — AB (ref 22–32)
Creatinine, Ser: 0.79 mg/dL (ref 0.44–1.00)
Glucose, Bld: 106 mg/dL — ABNORMAL HIGH (ref 65–99)
Potassium: 3.7 mmol/L (ref 3.5–5.1)
Sodium: 137 mmol/L (ref 135–145)
Total Bilirubin: 0.6 mg/dL (ref 0.3–1.2)
Total Protein: 7.6 g/dL (ref 6.5–8.1)

## 2017-04-17 LAB — PREGNANCY, URINE: Preg Test, Ur: NEGATIVE

## 2017-04-17 LAB — TSH: TSH: 1.174 u[IU]/mL (ref 0.350–4.500)

## 2017-04-17 NOTE — Progress Notes (Signed)
Wyline MoodKiran Delawrence Fridman MD, MRCP(U.K) [address]  [address]  [place], [place] [address]  Main: [phone]  Fax: [phone]   Primary Care Physician: Lyndon CodeKhan, Fozia M, MD  Primary Gastroenterologist:  Dr. Wyline MoodKiran Maron Stanzione   No chief complaint on file.   HPI: Pamela Benjamin is a 26 y.o. female    She is here today for a follow up visit.   Summary of history :  She was initially and last seen on 03/06/17 when she was referred for an enlarged liver and abdominal pain. I did not have any prior records suggestive of hepatomegaly.  RUQ USG 07/2016-normal size of liver UGI with SBFT 01/2017 showed hiatal hernia, reflux otherwise normal.  At the last visit she said she had a lot of vomiting , diarrhea after meals. When she throws up , its only food, undigested. Denies the sensation of food sitting in her stomach for a long time. Abdominal pain is not really an issue. She had been under a lot of stress , gained weight . My impression at her initial visit was that her hepatomegaly was likely secondary to fatty liver, and her vomiting may have been from reflux/gastroparesis/functional.    Interval history   03/06/2017-  04/17/2017    I ordered a gastric emptying study which has not been performed , Labs ordered too have not been obtained. I commenced her on a PPI but did not take it or pick it up  . Overall doing the same.Still throwing up .    Current Outpatient Prescriptions  Medication Sig Dispense Refill  . medroxyPROGESTERone (DEPO-PROVERA) 150 MG/ML injection Inject 150 mg into the muscle every 3 (three) months.    . methylphenidate (RITALIN) 10 MG tablet Take 10 mg by mouth every morning.    . methylphenidate 36 MG PO CR tablet Take by mouth.    Marland Kitchen. omeprazole (PRILOSEC) 40 MG capsule Take 1 capsule (40 mg total) by mouth daily. 90 capsule 3  . ondansetron (ZOFRAN) 4 MG tablet Take 1 tablet (4 mg total) by mouth every 8 (eight) hours as needed for nausea or vomiting. 8 tablet 0  .  oxcarbazepine (TRILEPTAL) 600 MG tablet Take by mouth.    . propranolol (INDERAL) 10 MG tablet Take by mouth.    . topiramate (TOPAMAX) 100 MG tablet Take 1/2 tab in the morning and 1 1/2 tab at night     No current facility-administered medications for this visit.     Allergies as of 04/17/2017 - Review Complete 03/06/2017  Allergen Reaction Noted  . Other Other (See Comments) 07/01/2016  . Pollen extract Other (See Comments) 07/01/2016  . Latex Rash 02/02/2015    ROS:  General: Negative for anorexia, weight loss, fever, chills, fatigue, weakness. ENT: Negative for hoarseness, difficulty swallowing , nasal congestion. CV: Negative for chest pain, angina, palpitations, dyspnea on exertion, peripheral edema.  Respiratory: Negative for dyspnea at rest, dyspnea on exertion, cough, sputum, wheezing.  GI: See history of present illness. GU:  Negative for dysuria, hematuria, urinary incontinence, urinary frequency, nocturnal urination.  Endo: Negative for unusual weight change.    Physical Examination:   There were no vitals taken for this visit.  General: Well-nourished, well-developed in no acute distress.  Eyes: No icterus. Conjunctivae pink. Mouth: Oropharyngeal mucosa moist and pink , no lesions erythema or exudate. Lungs: Clear to auscultation bilaterally. Non-labored. Heart: Regular rate and rhythm, no murmurs rubs or gallops.  Abdomen: Bowel sounds are normal, nontender, nondistended, no hepatosplenomegaly or masses,  no abdominal bruits or hernia , no rebound or guarding.   Extremities: No lower extremity edema. No clubbing or deformities. Neuro: Alert and oriented x 3.  Grossly intact. Skin: Warm and dry, no jaundice.   Psych: Alert and cooperative, normal mood and affect.   Imaging Studies: No results found.  Assessment and Plan:   Pamela Benjamin is a 26 y.o. y/o female here to follow up to her last visit. Still throwing up . She did not obtain the lab work or  gastric emptying study or commence on the PPI as ordered the last time. Explained that if I were to help her she would have to adhere to the treatment plan .  Plan  1. Schedule tests ordered last time and commence on PPI    Dr Wyline Mood  MD,MRCP [facility]) Follow up in 3 months

## 2017-04-17 NOTE — Telephone Encounter (Signed)
Attempted to contact patient and lvm, however, pts vm not setup.   Gastric Emptying Study Date: Friday, June 8th Time: 930am Location: [facility]  NPO 8 hours No PPI's for 8 hours

## 2017-04-18 ENCOUNTER — Telehealth: Payer: Self-pay

## 2017-04-18 NOTE — Telephone Encounter (Signed)
Advised pt of gastric emptying study.  NPO 8 hours No PPI's

## 2017-04-20 LAB — CELIAC DISEASE PANEL
Endomysial Ab, IgA: NEGATIVE
IgA: 143 mg/dL (ref 87–352)
Tissue Transglutaminase Ab, IgA: <2 U/mL (ref 0–3)

## 2017-04-23 ENCOUNTER — Telehealth: Payer: Self-pay

## 2017-04-23 NOTE — Telephone Encounter (Signed)
Advised patient of results per Dr. Tobi BastosAnna.   Inform labs are normal

## 2017-04-23 NOTE — Telephone Encounter (Signed)
-----   Message from Wyline MoodKiran [name], MD sent at 04/21/2017  6:55 PM EDT ----- Inform labs are normal

## 2017-05-02 ENCOUNTER — Other Ambulatory Visit
Admission: RE | Admit: 2017-05-02 | Discharge: 2017-05-02 | Disposition: A | Discharge status: Home / Self Care | Payer: Medicaid Other | Source: Ambulatory Visit | Attending: Gastroenterology | Admitting: Gastroenterology

## 2017-05-02 ENCOUNTER — Other Ambulatory Visit: Payer: Self-pay

## 2017-05-02 ENCOUNTER — Ambulatory Visit
Admission: RE | Admit: 2017-05-02 | Discharge: 2017-05-02 | Disposition: A | Discharge status: Home / Self Care | Payer: Medicaid Other | Source: Ambulatory Visit | Attending: Gastroenterology | Admitting: Gastroenterology

## 2017-05-02 DIAGNOSIS — R110 Nausea: Secondary | ICD-10-CM | POA: Insufficient documentation

## 2017-05-02 DIAGNOSIS — R112 Nausea with vomiting, unspecified: Secondary | ICD-10-CM

## 2017-05-02 LAB — PREGNANCY, URINE: PREG TEST UR: NEGATIVE

## 2017-05-02 MED ORDER — TECHNETIUM TC 99M SULFUR COLLOID
2.2500 | Freq: Once | INTRAVENOUS | Status: AC | PRN
  Administered 2017-05-02: 2.25 via INTRAVENOUS

## 2017-05-05 ENCOUNTER — Telehealth: Payer: Self-pay

## 2017-05-05 NOTE — Telephone Encounter (Signed)
-----   Message from Wyline MoodKiran [name], MD sent at 05/04/2017  5:02 PM EDT ----- Normal study

## 2017-05-05 NOTE — Telephone Encounter (Signed)
LVM for patient callback with results per Dr. Tobi BastosAnna  Normal study

## 2017-06-07 ENCOUNTER — Encounter: Payer: Self-pay | Admitting: Emergency Medicine

## 2017-06-07 DIAGNOSIS — Z5321 Procedure and treatment not carried out due to patient leaving prior to being seen by health care provider: Secondary | ICD-10-CM | POA: Insufficient documentation

## 2017-06-07 DIAGNOSIS — M25531 Pain in right wrist: Secondary | ICD-10-CM | POA: Diagnosis not present

## 2017-06-07 DIAGNOSIS — M25532 Pain in left wrist: Secondary | ICD-10-CM | POA: Insufficient documentation

## 2017-06-07 NOTE — ED Triage Notes (Signed)
Patient here with complaint of pain to bilateral wrist that started this afternoon. Patient denies any injury.

## 2017-06-08 ENCOUNTER — Emergency Department
Admission: EM | Admit: 2017-06-08 | Discharge: 2017-06-08 | Disposition: A | Discharge status: Home / Self Care | Payer: Medicaid Other | Attending: Emergency Medicine | Admitting: Emergency Medicine

## 2017-07-21 ENCOUNTER — Ambulatory Visit: Payer: Medicaid Other | Admitting: Gastroenterology

## 2017-08-03 ENCOUNTER — Emergency Department
Admission: EM | Admit: 2017-08-03 | Discharge: 2017-08-03 | Disposition: A | Discharge status: Home / Self Care | Payer: Medicaid Other | Attending: Emergency Medicine | Admitting: Emergency Medicine

## 2017-08-03 DIAGNOSIS — Z91040 Latex allergy status: Secondary | ICD-10-CM | POA: Insufficient documentation

## 2017-08-03 DIAGNOSIS — I10 Essential (primary) hypertension: Secondary | ICD-10-CM | POA: Insufficient documentation

## 2017-08-03 DIAGNOSIS — Z79899 Other long term (current) drug therapy: Secondary | ICD-10-CM | POA: Insufficient documentation

## 2017-08-03 DIAGNOSIS — J45909 Unspecified asthma, uncomplicated: Secondary | ICD-10-CM | POA: Insufficient documentation

## 2017-08-03 DIAGNOSIS — R0981 Nasal congestion: Secondary | ICD-10-CM | POA: Diagnosis present

## 2017-08-03 DIAGNOSIS — J069 Acute upper respiratory infection, unspecified: Secondary | ICD-10-CM | POA: Diagnosis not present

## 2017-08-03 MED ORDER — FLUTICASONE PROPIONATE 50 MCG/ACT NA SUSP: 1.0000 | Freq: Every day | NASAL | 1 refills | Status: DC

## 2017-08-03 NOTE — ED Triage Notes (Signed)
Pt c/o sinus congestion and right ear pain for the past week, [facility], but states she sneezed today and not she cant hear out of the right ear..Marland Kitchen

## 2017-08-03 NOTE — ED Provider Notes (Signed)
[facility] Emergency Department Provider Note  ____________________________________________  Time seen: Approximately 4:59 PM  I have reviewed the triage vital signs and the nursing notes.   HISTORY  Chief Complaint URI    HPI [name] is a 26 y.o. female presenting to the emergency department with concern regarding rhinorrhea, congestion and nonproductive cough that has occurred for the past 2 days. Patient states that on Friday, 08/01/2017, she was diagnosed with right otitis media and has been taking amoxicillin as directed. Patient states that her ear pain has improved but URI symptoms have subsequently occurred. Patient denies fever, nausea, vomiting and diarrhea. She is tolerating fluids and food by mouth without difficulty. No changes in stooling or urinary habits. No alleviating measures have been attempted. She does not currently work. She currently attends a day program. Patient states that she likes to play with her 28-year-old dog for fun.   Past Medical History:  Diagnosis Date  . ADHD (attention deficit hyperactivity disorder)   . Asthma   . Hypertension    Propanolol 2 pills bid, pt unaware of dose  . Mood disorder [facility](HCC)     Patient Active Problem List   Diagnosis Date Noted  . Chronic tension-type headache, not intractable 07/01/2016  . Pain in throat 02/23/2015  . OSA on CPAP 02/02/2015  . Whole body pain 02/02/2015    History reviewed. No pertinent surgical history.  Prior to Admission medications   Medication Sig Start Date End Date Taking? Authorizing Provider  EPINEPHrine 0.3 mg/0.3 mL IJ SOAJ injection Inject into the muscle.    [provider]  fluticasone (FLONASE) 50 MCG/ACT nasal spray Place 1 spray into both nostrils daily. 08/03/17 08/08/17  Orvil FeilWoods, Cordell Coke M, PA-C  medroxyPROGESTERone (DEPO-PROVERA) 150 MG/ML injection Inject 150 mg into the muscle every 3 (three) months.    [provider]   methylphenidate (RITALIN) 10 MG tablet Take 10 mg by mouth every morning.    [provider]  methylphenidate 36 MG PO CR tablet Take by mouth.    [provider]  omeprazole (PRILOSEC) 40 MG capsule Take 1 capsule (40 mg total) by mouth daily. 03/06/17 05/06/17  Wyline MoodAnna, [name], MD  ondansetron (ZOFRAN) 4 MG tablet Take 1 tablet (4 mg total) by mouth every 8 (eight) hours as needed for nausea or vomiting. 08/11/16   Jeanmarie PlantMcShane, James A, MD  oxcarbazepine (TRILEPTAL) 600 MG tablet Take by mouth.    [provider]  propranolol (INDERAL) 10 MG tablet Take by mouth.    [provider]  topiramate (TOPAMAX) 100 MG tablet Take 1/2 tab in the morning and 1 1/2 tab at night 04/17/16   [provider]    Allergies Other; Pollen extract; and Latex  No family history on file.  Social History Social History  Substance Use Topics  . Smoking status: Never Smoker  . Smokeless tobacco: Never Used  . Alcohol use No    Review of Systems  Constitutional: Patient has been afebrile. Eyes: No visual changes. No discharge ENT: Patient has had congestion.  Cardiovascular: no chest pain. Respiratory: Patient has had non-productive cough.  No SOB. Gastrointestinal: No nausea, vomiting or diarrhea. Genitourinary: Negative for dysuria. No hematuria Musculoskeletal: Patient has had myalgias. Skin: Negative for rash, abrasions, lacerations, ecchymosis. Neurological: Negative for headaches, focal weakness or numbness.   ____________________________________________   PHYSICAL EXAM:  VITAL SIGNS: ED Triage Vitals  Enc Vitals Group     BP 08/03/17 1555 (!) 157/84  Pulse Rate 08/03/17 1555 74     Resp 08/03/17 1555 18     Temp 08/03/17 1555 97.6 F (36.4 C)     Temp Source 08/03/17 1555 Oral     SpO2 08/03/17 1555 98 %     Weight 08/03/17 1556 (!) 334 lb (151.5 kg)     Height 08/03/17 1556  (1.626 m)     Head Circumference --      Peak Flow --       Pain Score 08/03/17 1554 10     Pain Loc --      Pain Edu? --      Excl. in GC? --      Constitutional: Alert and oriented. Patient is sitting upright playing with her phone. Eyes: Conjunctivae are normal. PERRL. EOMI. Head: Atraumatic. ENT:      Ears: Tympanic membranes are injected bilaterally without evidence of effusion or purulent exudate. Bony landmarks are visualized bilaterally. No pain with palpation at the tragus.      Nose: Nasal turbinates are edematous and erythematous. Copious rhinorrhea visualized.      Mouth/Throat: Mucous membranes are moist. Posterior pharynx is mildly erythematous. No tonsillar hypertrophy or purulent exudate. Uvula is midline. Neck: Full range of motion. No pain is elicited with flexion at the neck. Hematological/Lymphatic/Immunilogical: No cervical lymphadenopathy. Cardiovascular: Normal rate, regular rhythm. Normal S1 and S2.  Good peripheral circulation. Respiratory: Normal respiratory effort without tachypnea or retractions. Lungs CTAB. Good air entry to the bases with no decreased or absent breath sounds. Psychiatric: Mood and affect are normal. Speech and behavior are normal. Patient exhibits appropriate insight and judgement.  ____________________________________________   LABS (all labs ordered are listed, but only abnormal results are displayed)  Labs Reviewed - No data to display ____________________________________________  EKG   ____________________________________________  RADIOLOGY   No results found.  ____________________________________________    PROCEDURES  Procedure(s) performed:    Procedures    Medications - No data to display   ____________________________________________   INITIAL IMPRESSION / ASSESSMENT AND PLAN / ED COURSE  Pertinent labs & imaging results that were available during my care of the patient were reviewed by me and considered in my medical decision making (see chart for  details).  Review of the [place] CSRS was performed in accordance of the [facility] prior to dispensing any controlled drugs.    Assessment and plan Viral URI Patient presents to the emergency department with rhinorrhea, congestion and nonproductive cough for the past 2 days. History and physical exam findings are consistent with a viral upper respiratory tract infection. Patient was discharged with Flonase. Vital signs are reassuring prior to discharge. All patient questions were answered. Patient was advised to follow-up with primary care as needed.   ____________________________________________  FINAL CLINICAL IMPRESSION(S) / ED DIAGNOSES  Final diagnoses:  Viral upper respiratory tract infection      NEW MEDICATIONS STARTED DURING THIS VISIT:  New Prescriptions   FLUTICASONE (FLONASE) 50 MCG/ACT NASAL SPRAY    Place 1 spray into both nostrils daily.        This chart was dictated using voice recognition software/Dragon. Despite best efforts to proofread, errors can occur which can change the meaning. Any change was purely unintentional.    Orvil Feil, PA-C 08/03/17 1707    Jeanmarie Plant, MD 08/03/17 2222

## 2017-08-03 NOTE — ED Notes (Signed)

## 2017-08-13 ENCOUNTER — Other Ambulatory Visit: Payer: Self-pay | Admitting: Unknown Physician Specialty

## 2017-08-13 DIAGNOSIS — H9211 Otorrhea, right ear: Secondary | ICD-10-CM

## 2017-08-20 ENCOUNTER — Ambulatory Visit (INDEPENDENT_AMBULATORY_CARE_PROVIDER_SITE_OTHER): Payer: Medicaid Other | Admitting: Gastroenterology

## 2017-08-20 ENCOUNTER — Encounter: Payer: Self-pay | Admitting: Gastroenterology

## 2017-08-20 VITALS — BP 121/66 | HR 95 | Temp 98.0°F | Ht 64.0 in | Wt 333.2 lb

## 2017-08-20 DIAGNOSIS — R197 Diarrhea, unspecified: Secondary | ICD-10-CM | POA: Diagnosis not present

## 2017-08-20 NOTE — Progress Notes (Signed)
Wyline Mood MD, MRCP(U.K) [address]  [address]  [place], [place] [address]  Main: [phone]  Fax: [phone]   Primary Care Physician: Lyndon Code, MD  Primary Gastroenterologist:  Dr. Wyline Mood   Chief Complaint  Patient presents with  . Diarrhea    HPI: Pamela Benjamin is a 26 y.o. female   She is here today for a follow up visit for vomiting and diarrhea .   Summary of history :  She was initially and last seen on 03/06/17 when she was referred for an enlarged liver and abdominal pain. I did not have any prior records suggestive of hepatomegaly.  RUQ USG 07/2016-normal size of liver UGI with SBFT 01/2017 showed hiatal hernia, reflux otherwise normal.   Interval history   04/17/2017 -07/2017  Gastric emptying study was normal in 04/2017 . Labs 03/2017 CBC, CMP,TSH -normal  Gained 15 lbs since last office visit.   Since last visit having diarrhea- multiple times a day , ongoing since last visit , vomiting has stopped. No blood in her stool. Dark brown in color. No NSAID's. No artificial sugars in her diet , 1 soda a day , diarrhea worse with stress. No abdominal pain . No family history of colon cancer. Diarrhea ongoing for >6 months.   Current Outpatient Prescriptions  Medication Sig Dispense Refill  . EPINEPHrine 0.3 mg/0.3 mL IJ SOAJ injection Inject into the muscle.    . medroxyPROGESTERone (DEPO-PROVERA) 150 MG/ML injection Inject 150 mg into the muscle every 3 (three) months.    . methylphenidate 36 MG PO CR tablet Take by mouth.    . ondansetron (ZOFRAN) 4 MG tablet Take 1 tablet (4 mg total) by mouth every 8 (eight) hours as needed for nausea or vomiting. 8 tablet 0  . oxcarbazepine (TRILEPTAL) 600 MG tablet Take by mouth.    . propranolol (INDERAL) 10 MG tablet Take by mouth.    . topiramate (TOPAMAX) 100 MG tablet Take 1/2 tab in the morning and 1 1/2 tab at night    . traZODone (DESYREL) 150 MG tablet Take by mouth.    . fluticasone (FLONASE)  50 MCG/ACT nasal spray Place 1 spray into both nostrils daily. 16 g 1   No current facility-administered medications for this visit.     Allergies as of 08/20/2017 - Review Complete 08/20/2017  Allergen Reaction Noted  . Other Other (See Comments) 07/01/2016  . Pollen extract Other (See Comments) 07/01/2016  . Latex Rash 02/02/2015    ROS:  General: Negative for anorexia, weight loss, fever, chills, fatigue, weakness. ENT: Negative for hoarseness, difficulty swallowing , nasal congestion. CV: Negative for chest pain, angina, palpitations, dyspnea on exertion, peripheral edema.  Respiratory: Negative for dyspnea at rest, dyspnea on exertion, cough, sputum, wheezing.  GI: See history of present illness. GU:  Negative for dysuria, hematuria, urinary incontinence, urinary frequency, nocturnal urination.  Endo: Negative for unusual weight change.    Physical Examination:   BP 121/66 (BP Location: Left Arm, Patient Position: Sitting, Cuff Size: Large)   Pulse 95   Temp 98 F (36.7 C) (Oral)   Ht  (1.626 m)   Wt (!) 333 lb 3.2 oz (151.1 kg)   BMI 57.19 kg/m   General: Well-nourished, well-developed in no acute distress.  Eyes: No icterus. Conjunctivae pink. Mouth: Oropharyngeal mucosa moist and pink , no lesions erythema or exudate. Lungs: Clear to auscultation bilaterally. Non-labored. Heart: Regular rate and rhythm, no murmurs rubs or gallops.  Abdomen: Bowel sounds are normal, nontender, nondistended, no hepatosplenomegaly or masses, no abdominal bruits or hernia , no rebound or guarding.   Extremities: No lower extremity edema. No clubbing or deformities. Neuro: Alert and oriented x 3.  Grossly intact. Skin: Warm and dry, no jaundice.   Psych: Alert and cooperative, normal mood and affect.   Imaging Studies: No results found.  Assessment and Plan:   Pamela Benjamin is a 26 y.o. y/o female  here to follow up to her last visit. Since last visit no vomiting or   abdominal pain but has diarrhea which is ongoing > 6 months  .  Plan  1. Imodium PRN  2. Stool studies for diarrhea and if negative will proceed with colonoscopy   I have discussed alternative options, risks & benefits,  which include, but are not limited to, bleeding, infection, perforation,respiratory complication & drug reaction.  The patient agrees with this plan & written consent will be obtained.       Dr Wyline Mood  MD,MRCP [facility]) Follow up in 8 weeks

## 2017-08-21 ENCOUNTER — Other Ambulatory Visit
Admission: RE | Admit: 2017-08-21 | Discharge: 2017-08-21 | Disposition: A | Discharge status: Home / Self Care | Payer: Medicaid Other | Source: Ambulatory Visit | Attending: Gastroenterology | Admitting: Gastroenterology

## 2017-08-21 ENCOUNTER — Ambulatory Visit
Admission: RE | Admit: 2017-08-21 | Discharge: 2017-08-21 | Disposition: A | Discharge status: Home / Self Care | Payer: Medicaid Other | Source: Ambulatory Visit | Attending: Unknown Physician Specialty | Admitting: Unknown Physician Specialty

## 2017-08-21 DIAGNOSIS — R600 Localized edema: Secondary | ICD-10-CM | POA: Insufficient documentation

## 2017-08-21 DIAGNOSIS — H9211 Otorrhea, right ear: Secondary | ICD-10-CM | POA: Insufficient documentation

## 2017-08-22 ENCOUNTER — Other Ambulatory Visit
Admission: RE | Admit: 2017-08-22 | Discharge: 2017-08-22 | Disposition: A | Discharge status: Home / Self Care | Payer: Medicaid Other | Source: Ambulatory Visit | Attending: Gastroenterology | Admitting: Gastroenterology

## 2017-08-22 DIAGNOSIS — R197 Diarrhea, unspecified: Secondary | ICD-10-CM | POA: Insufficient documentation

## 2017-08-22 LAB — C DIFFICILE QUICK SCREEN W PCR REFLEX
C DIFFICILE (CDIFF) INTERP: NOT DETECTED
C DIFFICILE (CDIFF) TOXIN: NEGATIVE
C Diff antigen: NEGATIVE

## 2017-08-26 ENCOUNTER — Telehealth: Payer: Self-pay

## 2017-08-26 LAB — CALPROTECTIN, FECAL

## 2017-08-26 NOTE — Telephone Encounter (Signed)
-----   Message from Wyline Mood, MD sent at 08/24/2017  1:28 PM EDT ----- c diff negative

## 2017-08-26 NOTE — Telephone Encounter (Signed)
Unable to lvm for patient due to mailbox being full.   Results per Dr. Tobi Bastos due.

## 2017-09-01 ENCOUNTER — Telehealth: Payer: Self-pay

## 2017-09-01 NOTE — Telephone Encounter (Signed)
Advised patient of results per Dr. Tobi Bastos.   No evidence of GI tract inflammation

## 2017-09-01 NOTE — Telephone Encounter (Signed)
-----   Message from Wyline Mood, MD sent at 08/31/2017 12:29 PM EDT ----- No evidence of GI tract inflammation

## 2017-12-08 ENCOUNTER — Encounter: Payer: Self-pay | Admitting: Internal Medicine

## 2017-12-08 ENCOUNTER — Ambulatory Visit: Payer: Medicaid Other | Admitting: Internal Medicine

## 2017-12-08 VITALS — BP 149/88 | HR 92 | Resp 16 | Ht 64.0 in | Wt 342.8 lb

## 2017-12-08 DIAGNOSIS — G4730 Sleep apnea, unspecified: Secondary | ICD-10-CM

## 2017-12-08 DIAGNOSIS — J301 Allergic rhinitis due to pollen: Secondary | ICD-10-CM | POA: Diagnosis not present

## 2017-12-08 NOTE — Patient Instructions (Signed)

## 2017-12-08 NOTE — Progress Notes (Signed)
[facility] [address] [place], [place] [address]  Pulmonary Sleep Medicine  Office Visit Note  Patient Name: Pamela AdieMelissa S Morella DOB: 10-24-1991 MRN [id number]  Date of Service: 12/08/2017     Complaints/HPI: She is doing better with OSA. She states that she is using the CPAP device at least 8 hours per night. She is less tired in the daytime. She has been not dosing or sleeping. 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 She has no admissions to the hospital noted. Denies chest pain no plapitations no EDS noted  Current Medication: Outpatient Encounter Medications as of 12/08/2017  Medication Sig  . clindamycin (CLINDAGEL) 1 % gel Apply topically 2 (two) times daily as needed.  . clotrimazole (LOTRIMIN) 1 % cream Apply 1 application topically as needed.  Marland Kitchen. EPINEPHrine 0.3 mg/0.3 mL IJ SOAJ injection Inject into the muscle.  . fluticasone (FLONASE) 50 MCG/ACT nasal spray Place 1 spray into both nostrils daily.  . medroxyPROGESTERone (DEPO-PROVERA) 150 MG/ML injection Inject 150 mg into the muscle every 3 (three) months.  . methylphenidate 36 MG PO CR tablet Take by mouth.  . ondansetron (ZOFRAN) 4 MG tablet Take 1 tablet (4 mg total) by mouth every 8 (eight) hours as needed for nausea or vomiting.  Marland Kitchen. oxcarbazepine (TRILEPTAL) 600 MG tablet Take by mouth.  . propranolol (INDERAL) 10 MG tablet Take by mouth.  . topiramate (TOPAMAX) 100 MG tablet Take 1/2 tab in the morning and 1 1/2 tab at night  . traZODone (DESYREL) 150 MG tablet Take by mouth.   No facility-administered encounter medications on file as of 12/08/2017.     Surgical History: History reviewed. No pertinent surgical history.  Medical History: Past Medical History:  Diagnosis Date  . ADHD (attention deficit hyperactivity disorder)   . Asthma   . Hypertension    Propanolol 2 pills bid, pt unaware of dose  . Mood disorder (HCC)     Family History: Family History  Adopted: Yes    Social  History: Social History   Socioeconomic History  . Marital status: Single    Spouse name: Not on file  . Number of children: Not on file  . Years of education: Not on file  . Highest education level: Not on file  Social Needs  . Financial resource strain: Not on file  . Food insecurity - worry: Not on file  . Food insecurity - inability: Not on file  . Transportation needs - medical: Not on file  . Transportation needs - non-medical: Not on file  Occupational History  . Not on file  Tobacco Use  . Smoking status: Never Smoker  . Smokeless tobacco: Never Used  Substance and Sexual Activity  . Alcohol use: No  . Drug use: No  . Sexual activity: Not on file  Other Topics Concern  . Not on file  Social History Narrative  . Not on file     ROS  General: (-) fever, (-) chills, (-) night sweats, (-) weakness, (-) changes in appetite. Skin: (-) rashes, (-) itching,. Eyes: (-) visual changes, (-) redness, (-) itching, (-) double or blurred vision. Nose and Sinuses: (-) nasal stuffiness or itchiness, (-) postnasal drip, (-) nosebleeds, (-) sinus trouble. Mouth and Throat: (-) sore throat, (-) hoarseness. Neck: (-) swollen glands, (-) enlarged thyroid, (-) neck pain. Respiratory: (-) cough, (-) bloody sputum, (-) shortness of breath, (-) wheezing. Cardiovascular: (-) ankle swelling, (-) chest pain. Lymphatic: (-) lymph node enlargement, (-) lymph node tenderness.  Neurologic: (-) numbness, (-) tingling,(-) dizziness. Psychiatric: (-) anxiety, (-) depression.  Vital Signs: Blood pressure (!) 149/88, pulse 92, resp. rate 16, height 5\' 4"  (1.626 m), weight (!) 342 lb 12.8 oz (155.5 kg), SpO2 100 %.  Examination: General Appearance: The patient is well-developed, well-nourished, and in no distress. Skin: Gross inspection of skin demonstrates no evidence of abnormality. Head: Patient's head is normocephalic, no gross deformities. Eyes: no gross deformities noted. ENT: ears appear  grossly normal. Nasopharynx appears to be normal. Neck: Supple. No thyromegaly. No LAD. Respiratory: Lungs are clear to auscultation with no adventitious sounds. Cardiovascular: Normal S1 and S2 without murmur or rub. Extremities: No cyanosis. pulses are equal. Neurologic: Alert and oriented. No involuntary movements.  LABS: No results found for this or any previous visit (from the past 2160 hour(s)).  Radiology: No results found.  No results found.  No results found.    Assessment and Plan: Patient Active Problem List   Diagnosis Date Noted  . Chronic tension-type headache, not intractable 07/01/2016  . Pain in throat 02/23/2015  . OSA on CPAP 02/02/2015  . Whole body pain 02/02/2015    1. OSA She is doing well with her CPAP device Now has improved compliance of at least 74% Encourage usage  2. Morbid obesity She is trying to work on weight loss  3. Allergic rhinitis She is not on shots and states as long as ragweed is not out she is ok   General Counseling: I have discussed the findings of the evaluation and examination with Zillah.  I have also discussed any further diagnostic evaluation thatmay be needed or ordered today. Adriel verbalizes understanding of the findings of todays visit. We also reviewed her medications today and discussed drug interactions and side effects including but not limited excessive drowsiness and altered mental states. We also discussed that there is always a risk not just to her but also people around her. she has been encouraged to call the office with any questions or concerns that should arise related to todays visit.    Time spent: 25  I have personally obtained a history, examined the patient, evaluated laboratory and imaging results, formulated the assessment and plan and placed orders.    Yevonne Pax, MD [facility] Pulmonary and Critical Care Sleep medicine

## 2017-12-22 ENCOUNTER — Ambulatory Visit: Payer: Medicaid Other | Admitting: Nurse Practitioner

## 2017-12-22 ENCOUNTER — Encounter: Payer: Self-pay | Admitting: Nurse Practitioner

## 2017-12-22 VITALS — BP 120/80 | HR 70 | Resp 16 | Ht 64.0 in | Wt 339.2 lb

## 2017-12-22 DIAGNOSIS — R300 Dysuria: Secondary | ICD-10-CM

## 2017-12-22 DIAGNOSIS — I10 Essential (primary) hypertension: Secondary | ICD-10-CM | POA: Diagnosis not present

## 2017-12-22 DIAGNOSIS — Z9989 Dependence on other enabling machines and devices: Secondary | ICD-10-CM

## 2017-12-22 DIAGNOSIS — M25561 Pain in right knee: Secondary | ICD-10-CM

## 2017-12-22 DIAGNOSIS — G4733 Obstructive sleep apnea (adult) (pediatric): Secondary | ICD-10-CM

## 2017-12-22 DIAGNOSIS — Z0001 Encounter for general adult medical examination with abnormal findings: Secondary | ICD-10-CM | POA: Diagnosis not present

## 2017-12-22 MED ORDER — PHENTERMINE HCL 37.5 MG PO TABS: 37.5000 mg | ORAL_TABLET | Freq: Every day | ORAL | 0 refills | Status: DC

## 2017-12-22 NOTE — Progress Notes (Addendum)
[facility] [address] [place], [place] [address]  Internal MEDICINE  Office Visit Note  Patient Name: Pamela AdieMelissa S Benjamin  [birth year]  [id number]  Date of Service: 12/22/2017  No chief complaint on file.    The patient is here for health maintenance exam. She has been experiencing weakness in the right knee. Gives out on her and causes her to fall. No trauma or injury that she is aware of. Would like to see orthopedics to get knee brace for support.  Would like to go on phentermine to help with weight loss. She is off of her concerta. Blood pressure and heart rate are within normal limits.    Knee Pain   The incident occurred more than 1 week ago. Incident location: no incident. There was no injury mechanism. The pain is present in the right knee. The quality of the pain is described as aching. The pain is at a severity of 6/10. The pain is moderate. The pain has been fluctuating since onset. Associated symptoms include an inability to bear weight and muscle weakness. She reports no foreign bodies present. The symptoms are aggravated by weight bearing and movement. She has tried nothing for the symptoms.   Pt is here for routine health maintenance examination  Current Medication: Outpatient Encounter Medications as of 12/22/2017  Medication Sig  . clindamycin (CLINDAGEL) 1 % gel Apply topically 2 (two) times daily as needed.  . clotrimazole (LOTRIMIN) 1 % cream Apply 1 application topically as needed.  Marland Kitchen. EPINEPHrine 0.3 mg/0.3 mL IJ SOAJ injection Inject into the muscle.  . medroxyPROGESTERone (DEPO-PROVERA) 150 MG/ML injection Inject 150 mg into the muscle every 3 (three) months.  . ondansetron (ZOFRAN) 4 MG tablet Take 1 tablet (4 mg total) by mouth every 8 (eight) hours as needed for nausea or vomiting.  . Oxcarbazepine (TRILEPTAL) 300 MG tablet Take 300 mg by mouth 2 (two) times daily.  . propranolol (INDERAL) 10 MG tablet Take by mouth.  . topiramate (TOPAMAX) 100  MG tablet Take 1/2 tab in the morning and 1 1/2 tab at night  . traZODone (DESYREL) 150 MG tablet Take by mouth.  . fluticasone (FLONASE) 50 MCG/ACT nasal spray Place 1 spray into both nostrils daily.  . methylphenidate 36 MG PO CR tablet Take by mouth.  Marland Kitchen. oxcarbazepine (TRILEPTAL) 600 MG tablet Take by mouth.   No facility-administered encounter medications on file as of 12/22/2017.     Surgical History: No past surgical history on file.  Medical History: Past Medical History:  Diagnosis Date  . ADHD (attention deficit hyperactivity disorder)   . Asthma   . Hypertension    Propanolol 2 pills bid, pt unaware of dose  . Mood disorder (HCC)     Family History: Family History  Adopted: Yes      Review of Systems  Constitutional: Positive for unexpected weight change. Negative for activity change, appetite change and fatigue.       Weight gain of 15 pounds since most recent visit.   HENT: Negative for congestion, postnasal drip, rhinorrhea, sinus pressure, sinus pain and voice change.   Eyes: Negative.   Respiratory: Negative for cough, shortness of breath and wheezing.   Cardiovascular: Negative for chest pain and palpitations.  Gastrointestinal: Negative for constipation, diarrhea, nausea and vomiting.  Endocrine: Negative for cold intolerance, heat intolerance, polydipsia, polyphagia and polyuria.  Musculoskeletal: Positive for arthralgias.  Skin: Negative.   Allergic/Immunologic: Positive for environmental allergies.  Neurological: Positive for seizures and headaches.  Hematological: Negative.   Psychiatric/Behavioral: Positive for agitation and behavioral problems. The patient is nervous/anxious.        Patient treated per psychiatry    Today's Vitals   12/22/17 1034  BP: 120/80  Pulse: 70  Resp: 16  SpO2: 98%  Weight: (!) 339 lb 3.2 oz (153.9 kg)  Height: 5\' 4"  (1.626 m)    Physical Exam  Constitutional: She is oriented to person, place, and time. She  appears well-developed and well-nourished. No distress.  HENT:  Head: Normocephalic and atraumatic.  Mouth/Throat: Oropharynx is clear and moist. No oropharyngeal exudate.  Eyes: Conjunctivae and EOM are normal. Pupils are equal, round, and reactive to light.  Neck: Normal range of motion. Neck supple. No JVD present. No tracheal deviation present. No thyromegaly present.  Cardiovascular: Normal rate, regular rhythm, normal heart sounds and intact distal pulses. Exam reveals no gallop and no friction rub.  No murmur heard. Pulmonary/Chest: Effort normal and breath sounds normal. No respiratory distress. She has no wheezes. She has no rales. She exhibits no tenderness.  Abdominal: Soft. Bowel sounds are normal. There is no tenderness.  Genitourinary: No breast swelling, tenderness, discharge or bleeding.  Musculoskeletal:       Legs: Lymphadenopathy:    She has no cervical adenopathy.  Neurological: She is alert and oriented to person, place, and time. No cranial nerve deficit.  Skin: Skin is warm and dry. She is not diaphoretic.  Psychiatric: She has a normal mood and affect. Her behavior is normal. Judgment and thought content normal.  Nursing note and vitals reviewed.   Assessment/Plan:  1. Encounter for general adult medical examination with abnormal findings Annual wellness visit today.   2. Acute pain of right knee - DG Knee Complete 4 Views Right; Future - recommended rest, ice, compresion, and elevation of the knee when painful. Take OTC tylenol to help pain. May refer to orthopedics as indicated.   3. Essential hypertension Stable. Continue bp medication as prescribed.   4. Morbid obesity (HCC) - phentermine (ADIPEX-P) 37.5 MG tablet; Take 1 tablet (37.5 mg total) by mouth daily before breakfast.  Dispense: 30 tablet; Refill: 0 Nova diet plans as well as 1500 and 1800 calorie diets given to patient. Weight loss waiver letter was signed today.   5. Dysuria - Urinalysis,  Routine w reflex microscopic  6. OSA on CPAP Continue regular visits with Dr. Freda Munro for CPAP management. Discussed that weight loss should help reduce need for CPAP in the future.   She should return to the office in 1 month for weight loss management as well as depo-provera injection.   General Counseling: Dion verbalizes understanding of the findings of todays visit and agrees with plan of treatment. I have discussed any further diagnostic evaluation that may be needed or ordered today. We also reviewed her medications today. she has been encouraged to call the office with any questions or concerns that should arise related to todays visit.    There is a liability release in patients' chart. There has been a 10 minute discussion about the side effects including but not limited to elevated blood pressure, anxiety, lack of sleep and dry mouth. Pt understands and will like to start/continue on appetite suppressant at this time. There will be one month RX given at the time of visit with proper follow up. Nova diet plan with restricted calories is given to the pt. Pt understands and agrees with  plan of treatment  This patient was seen by  Vincent Gros, FNP- C in Collaboration with Dr Lyndon Code as a part of collaborative care agreement    Orders Placed This Encounter  Procedures  . Urinalysis, Routine w reflex microscopic     Time spent: 35 Minutes      Lyndon Code, MD  Internal Medicine

## 2017-12-23 DIAGNOSIS — I10 Essential (primary) hypertension: Secondary | ICD-10-CM | POA: Insufficient documentation

## 2017-12-23 DIAGNOSIS — O169 Unspecified maternal hypertension, unspecified trimester: Secondary | ICD-10-CM | POA: Insufficient documentation

## 2017-12-23 LAB — URINALYSIS, ROUTINE W REFLEX MICROSCOPIC
BILIRUBIN UA: NEGATIVE
Glucose, UA: NEGATIVE
KETONES UA: NEGATIVE
LEUKOCYTES UA: NEGATIVE
Nitrite, UA: NEGATIVE
PROTEIN UA: NEGATIVE
RBC UA: NEGATIVE
Specific Gravity, UA: 1.021 (ref 1.005–1.030)
UUROB: 0.2 mg/dL (ref 0.2–1.0)
pH, UA: 5.5 (ref 5.0–7.5)

## 2018-01-01 ENCOUNTER — Ambulatory Visit
Admission: RE | Admit: 2018-01-01 | Discharge: 2018-01-01 | Disposition: A | Discharge status: Home / Self Care | Payer: Medicaid Other | Source: Ambulatory Visit | Attending: Nurse Practitioner | Admitting: Nurse Practitioner

## 2018-01-01 DIAGNOSIS — M25561 Pain in right knee: Secondary | ICD-10-CM

## 2018-01-01 DIAGNOSIS — M1711 Unilateral primary osteoarthritis, right knee: Secondary | ICD-10-CM | POA: Insufficient documentation

## 2018-01-09 ENCOUNTER — Ambulatory Visit: Payer: Medicaid Other | Admitting: Nurse Practitioner

## 2018-01-09 ENCOUNTER — Encounter: Payer: Self-pay | Admitting: Nurse Practitioner

## 2018-01-09 VITALS — BP 128/88 | HR 86 | Resp 16 | Ht 64.0 in | Wt 342.4 lb

## 2018-01-09 DIAGNOSIS — Z3041 Encounter for surveillance of contraceptive pills: Secondary | ICD-10-CM | POA: Diagnosis not present

## 2018-01-09 DIAGNOSIS — H60311 Diffuse otitis externa, right ear: Secondary | ICD-10-CM | POA: Diagnosis not present

## 2018-01-09 DIAGNOSIS — Z3202 Encounter for pregnancy test, result negative: Secondary | ICD-10-CM | POA: Diagnosis not present

## 2018-01-09 LAB — POCT URINE PREGNANCY: PREG TEST UR: NEGATIVE

## 2018-01-09 MED ORDER — MEDROXYPROGESTERONE ACETATE 150 MG/ML IM SUSP
150.0000 mg | Freq: Once | INTRAMUSCULAR | Status: AC
Start: 2018-01-09 — End: 2018-01-09
  Administered 2018-01-09: 150 mg via INTRAMUSCULAR

## 2018-01-09 MED ORDER — CIPROFLOXACIN-DEXAMETHASONE 0.3-0.1 % OT SUSP: 4.0000 [drp] | Freq: Two times a day (BID) | OTIC | 0 refills | Status: DC

## 2018-01-09 MED ORDER — PHENTERMINE HCL 37.5 MG PO TABS: 37.5000 mg | ORAL_TABLET | Freq: Every day | ORAL | 0 refills | Status: DC

## 2018-01-09 NOTE — Progress Notes (Signed)
[facility] [address] [place], [place] [address]  Internal MEDICINE  Office Visit Note  Patient Name: Pamela AdieMelissa S Witters  [id number]-Dec-1992  [id number]  Date of Service: 01/18/2018  Chief Complaint  Patient presents with  . Weight Loss    management  . Injections    depo-provera    The patient is here for routine follow up exam. She was started on appettite suppressant at her last visit. Has lost 2 pounds since her most recent visit. Has had no negative side effects associated with taking the appetite Supressant.  She needs to have depo-provera injection today. Does not have regular periods due to depo-provera injections.     Pt is here for routine follow up.    Current Medication: Outpatient Encounter Medications as of 01/09/2018  Medication Sig  . busPIRone (BUSPAR) 10 MG tablet Take by mouth.  . clindamycin (CLINDAGEL) 1 % gel Apply topically 2 (two) times daily as needed.  . clotrimazole (LOTRIMIN) 1 % cream Apply 1 application topically as needed.  Marland Kitchen. EPINEPHrine 0.3 mg/0.3 mL IJ SOAJ injection Inject into the muscle.  . ketoconazole (NIZORAL) 2 % cream Apply topically.  . medroxyPROGESTERone (DEPO-PROVERA) 150 MG/ML injection Inject 150 mg into the muscle every 3 (three) months.  . ondansetron (ZOFRAN) 4 MG tablet Take 1 tablet (4 mg total) by mouth every 8 (eight) hours as needed for nausea or vomiting.  . Oxcarbazepine (TRILEPTAL) 300 MG tablet Take 300 mg by mouth 2 (two) times daily.  . phentermine (ADIPEX-P) 37.5 MG tablet Take 1 tablet (37.5 mg total) by mouth daily before breakfast.  . propranolol (INDERAL) 10 MG tablet Take by mouth.  . traZODone (DESYREL) 150 MG tablet Take by mouth.  . [DISCONTINUED] phentermine (ADIPEX-P) 37.5 MG tablet Take 1 tablet (37.5 mg total) by mouth daily before breakfast.  . ciprofloxacin-dexamethasone (CIPRODEX) OTIC suspension Place 4 drops into both ears 2 (two) times daily.  . fluticasone (FLONASE) 50 MCG/ACT nasal spray  Place 1 spray into both nostrils daily.  . methylphenidate 36 MG PO CR tablet Take by mouth.  Marland Kitchen. oxcarbazepine (TRILEPTAL) 600 MG tablet Take by mouth.  . topiramate (TOPAMAX) 100 MG tablet Take 1/2 tab in the morning and 1 1/2 tab at night  . [EXPIRED] medroxyPROGESTERone (DEPO-PROVERA) injection 150 mg    No facility-administered encounter medications on file as of 01/09/2018.     Surgical History: History reviewed. No pertinent surgical history.  Medical History: Past Medical History:  Diagnosis Date  . ADHD (attention deficit hyperactivity disorder)   . Asthma   . Hypertension    Propanolol 2 pills bid, pt unaware of dose  . Mood disorder (HCC)     Family History: Family History  Adopted: Yes    Social History   Socioeconomic History  . Marital status: Single    Spouse name: Not on file  . Number of children: Not on file  . Years of education: Not on file  . Highest education level: Not on file  Social Needs  . Financial resource strain: Not on file  . Food insecurity - worry: Not on file  . Food insecurity - inability: Not on file  . Transportation needs - medical: Not on file  . Transportation needs - non-medical: Not on file  Occupational History  . Not on file  Tobacco Use  . Smoking status: Never Smoker  . Smokeless tobacco: Never Used  Substance and Sexual Activity  . Alcohol use: No  . Drug use: No  .  Sexual activity: Not on file  Other Topics Concern  . Not on file  Social History Narrative  . Not on file      Review of Systems  Constitutional: Negative for activity change, appetite change, fatigue and unexpected weight change.       Weight loss of 2 pounds since her last visit .  HENT: Positive for ear pain. Negative for congestion, postnasal drip, rhinorrhea, sinus pressure, sinus pain and voice change.   Eyes: Negative.   Respiratory: Negative for cough, shortness of breath and wheezing.   Cardiovascular: Negative for chest pain and  palpitations.  Gastrointestinal: Negative for constipation, diarrhea, nausea and vomiting.  Endocrine: Negative for cold intolerance, heat intolerance, polydipsia, polyphagia and polyuria.  Genitourinary:       Does not have regular periods.   Musculoskeletal: Positive for arthralgias.  Skin: Negative.   Allergic/Immunologic: Positive for environmental allergies.  Neurological: Negative for seizures and headaches.  Hematological: Negative.   Psychiatric/Behavioral: Positive for agitation and behavioral problems. The patient is nervous/anxious.        Patient treated per psychiatry    Vital Signs: BP 128/88   Pulse 86   Resp 16   Ht 5\' 4"  (1.626 m)   Wt (!) 342 lb 6.4 oz (155.3 kg)   SpO2 97%   BMI 58.77 kg/m    Physical Exam  Constitutional: She is oriented to person, place, and time. She appears well-developed and well-nourished. No distress.  HENT:  Head: Normocephalic and atraumatic.  Right Ear: Tympanic membrane is erythematous and bulging.  Left Ear: Tympanic membrane is erythematous and bulging.  Mouth/Throat: Oropharynx is clear and moist. No oropharyngeal exudate.  Eyes: EOM are normal. Pupils are equal, round, and reactive to light.  Neck: Normal range of motion. Neck supple. No JVD present. No tracheal deviation present. No thyromegaly present.  Cardiovascular: Normal rate, regular rhythm and normal heart sounds. Exam reveals no gallop and no friction rub.  No murmur heard. Pulmonary/Chest: Effort normal and breath sounds normal. No respiratory distress. She has no wheezes. She has no rales. She exhibits no tenderness.  Abdominal: Soft. Bowel sounds are normal. There is no tenderness.  Genitourinary:  Genitourinary Comments: Urine pregnancy test negative.   Musculoskeletal: Normal range of motion.  Lymphadenopathy:    She has no cervical adenopathy.  Neurological: She is alert and oriented to person, place, and time. No cranial nerve deficit.  Skin: Skin is warm  and dry. She is not diaphoretic.  Psychiatric: She has a normal mood and affect. Her behavior is normal. Judgment and thought content normal.  Nursing note and vitals reviewed.  Assessment/Plan: 1. Acute diffuse otitis externa of right ear - ciprofloxacin-dexamethasone (CIPRODEX) OTIC suspension; Place 4 drops into both ears 2 (two) times daily.  Dispense: 7.5 mL; Refill: 0  2. Morbid obesity (HCC) - phentermine (ADIPEX-P) 37.5 MG tablet; Take 1 tablet (37.5 mg total) by mouth daily before breakfast.  Dispense: 30 tablet; Refill: 0  3. Surveillance of previously prescribed contraceptive pill Depo-provera injection administered today - medroxyPROGESTERone (DEPO-PROVERA) injection 150 mg  4. Encounter for pregnancy test, result negative - POCT urine pregnancy   General Counseling: Burnett verbalizes understanding of the findings of todays visit and agrees with plan of treatment. I have discussed any further diagnostic evaluation that may be needed or ordered today. We also reviewed her medications today. she has been encouraged to call the office with any questions or concerns that should arise related to todays visit.  There is a liability release in patients' chart. There has been a 10 minute discussion about the side effects including but not limited to elevated blood pressure, anxiety, lack of sleep and dry mouth. Pt understands and will like to start/continue on appetite suppressant at this time. There will be one month RX given at the time of visit with proper follow up. Nova diet plan with restricted calories is given to the pt. Pt understands and agrees with  plan of treatment  This patient was seen by Vincent Gros, FNP- C in Collaboration with Dr Lyndon Code as a part of collaborative care agreement    Orders Placed This Encounter  Procedures  . POCT urine pregnancy    Meds ordered this encounter  Medications  . medroxyPROGESTERone (DEPO-PROVERA) injection 150 mg  .  ciprofloxacin-dexamethasone (CIPRODEX) OTIC suspension    Sig: Place 4 drops into both ears 2 (two) times daily.    Dispense:  7.5 mL    Refill:  0    Order Specific Question:   Supervising Provider    Answer:   Lyndon Code [[id number]]  . phentermine (ADIPEX-P) 37.5 MG tablet    Sig: Take 1 tablet (37.5 mg total) by mouth daily before breakfast.    Dispense:  30 tablet    Refill:  0    Order Specific Question:   Supervising Provider    Answer:   Lyndon Code [[id number]]    Time spent: 52 Minutes     Dr Lyndon Code Internal medicine

## 2018-02-16 ENCOUNTER — Ambulatory Visit: Payer: Self-pay | Admitting: Nurse Practitioner

## 2018-04-22 ENCOUNTER — Ambulatory Visit: Payer: Medicaid Other

## 2018-04-22 DIAGNOSIS — G4733 Obstructive sleep apnea (adult) (pediatric): Secondary | ICD-10-CM

## 2018-04-22 NOTE — Progress Notes (Signed)
95 percentile pressure 8   95th percentile leak 0.0   apnea index 0.1 /hr  apnea-hypopnea index  0.1 /hr   total days used  >4 hr 67 days  total days used <4 hr 12 days  Total compliance 74 percent  Pt doing great. Did ask about new machine, will check to see if it has been 5 years

## 2018-05-12 ENCOUNTER — Emergency Department
Admission: EM | Admit: 2018-05-12 | Discharge: 2018-05-12 | Disposition: A | Discharge status: Home / Self Care | Payer: Medicaid Other | Attending: Emergency Medicine | Admitting: Emergency Medicine

## 2018-05-12 ENCOUNTER — Encounter: Payer: Self-pay | Admitting: Emergency Medicine

## 2018-05-12 DIAGNOSIS — Z79899 Other long term (current) drug therapy: Secondary | ICD-10-CM | POA: Insufficient documentation

## 2018-05-12 DIAGNOSIS — I10 Essential (primary) hypertension: Secondary | ICD-10-CM | POA: Insufficient documentation

## 2018-05-12 DIAGNOSIS — R234 Changes in skin texture: Secondary | ICD-10-CM | POA: Diagnosis not present

## 2018-05-12 DIAGNOSIS — J45909 Unspecified asthma, uncomplicated: Secondary | ICD-10-CM | POA: Insufficient documentation

## 2018-05-12 MED ORDER — KETOCONAZOLE 2 % EX CREA: 1.0000 "application " | TOPICAL_CREAM | Freq: Two times a day (BID) | CUTANEOUS | 0 refills | Status: DC | PRN

## 2018-05-12 NOTE — ED Provider Notes (Signed)
[facility] Emergency Department Provider Note  ____________________________________________  Time seen: Approximately 9:43 PM  I have reviewed the triage vital signs and the nursing notes.   HISTORY  Chief Complaint Foot Problem   HPI Pamela Benjamin is a 27 y.o. female who presents to the emergency department for treatment and evaluation of peeling of both feet and toes for the past week with skin cracking between the toes.  Past Medical History:  Diagnosis Date  . ADHD (attention deficit hyperactivity disorder)   . Asthma   . Hypertension    Propanolol 2 pills bid, pt unaware of dose  . Mood disorder [facility])     Patient Active Problem List   Diagnosis Date Noted  . Hypertension 12/23/2017  . Chronic tension-type headache, not intractable 07/01/2016  . Pain in throat 02/23/2015  . OSA on CPAP 02/02/2015  . Whole body pain 02/02/2015    History reviewed. No pertinent surgical history.  Prior to Admission medications   Medication Sig Start Date End Date Taking? Authorizing Provider  busPIRone (BUSPAR) 10 MG tablet Take by mouth.    [provider]  ciprofloxacin-dexamethasone (CIPRODEX) OTIC suspension Place 4 drops into both ears 2 (two) times daily. 01/09/18   Carlean Jews, NP  clindamycin (CLINDAGEL) 1 % gel Apply topically 2 (two) times daily as needed.    [provider]  EPINEPHrine 0.3 mg/0.3 mL IJ SOAJ injection Inject into the muscle.    [provider]  fluticasone (FLONASE) 50 MCG/ACT nasal spray Place 1 spray into both nostrils daily. 08/03/17 08/08/17  Orvil Feil, PA-C  ketoconazole (NIZORAL) 2 % cream Apply 1 application topically 2 (two) times daily as needed for irritation. 05/12/18   Marcellius Montagna, Rulon Eisenmenger B, FNP  medroxyPROGESTERone (DEPO-PROVERA) 150 MG/ML injection Inject 150 mg into the muscle every 3 (three) months.    [provider]  methylphenidate 36 MG PO CR tablet Take by mouth.     [provider]  ondansetron (ZOFRAN) 4 MG tablet Take 1 tablet (4 mg total) by mouth every 8 (eight) hours as needed for nausea or vomiting. 08/11/16   Jeanmarie Plant, MD  Oxcarbazepine (TRILEPTAL) 300 MG tablet Take 300 mg by mouth 2 (two) times daily.    [provider]  oxcarbazepine (TRILEPTAL) 600 MG tablet Take by mouth.    [provider]  phentermine (ADIPEX-P) 37.5 MG tablet Take 1 tablet (37.5 mg total) by mouth daily before breakfast. 01/09/18   Carlean Jews, NP  propranolol (INDERAL) 10 MG tablet Take by mouth.    [provider]  topiramate (TOPAMAX) 100 MG tablet Take 1/2 tab in the morning and 1 1/2 tab at night 04/17/16   [provider]  traZODone (DESYREL) 150 MG tablet Take by mouth.    [provider]    Allergies Other; Pollen extract; and Latex  Family History  Adopted: Yes    Social History Social History   Tobacco Use  . Smoking status: Never Smoker  . Smokeless tobacco: Never Used  Substance Use Topics  . Alcohol use: No  . Drug use: No    Review of Systems  Constitutional: Negataive for fever. Respiratory: Negative for cough or shortness of breath.  Musculoskeletal: Negative for myalgias Skin: Positive for skin cracking and peeling. Neurological: Negative for numbness or paresthesias. ____________________________________________   PHYSICAL EXAM:  VITAL SIGNS: ED Triage Vitals  Enc Vitals Group     BP 05/12/18 2124 (!) 143/87  Pulse Rate 05/12/18 2124 88     Resp 05/12/18 2124 16     Temp 05/12/18 2124 97.6 F (36.4 C)     Temp Source 05/12/18 2124 Oral     SpO2 05/12/18 2124 99 %     Weight 05/12/18 2123 (!) 342 lb (155.1 kg)     Height --      Head Circumference --      Peak Flow --      Pain Score 05/12/18 2123 4     Pain Loc --      Pain Edu? --      Excl. in GC? --      Constitutional: Well appearing. Eyes: Conjunctivae are clear without discharge or  drainage. Nose: No rhinorrhea noted. Mouth/Throat: Airway is patent.  Neck: No stridor. Unrestricted range of motion observed. Cardiovascular: Capillary refill is <3 seconds.  Respiratory: Respirations are even and unlabored.. Musculoskeletal: Unrestricted range of motion observed. Neurologic: Awake, alert, and oriented x 4.  Skin:  Fissuring of the skin on the plantar surface of both feet between the toes.  ____________________________________________   LABS (all labs ordered are listed, but only abnormal results are displayed)  Labs Reviewed - No data to display ____________________________________________  EKG  Not indicated. ____________________________________________  RADIOLOGY  Not indicated. ____________________________________________   PROCEDURES  Procedures ____________________________________________   INITIAL IMPRESSION / ASSESSMENT AND PLAN / ED COURSE  Pamela Benjamin is a 27 y.o. female who presents to the emergency department for treatment and evaluation of skin fissures on bilateral feet. She will be treated with ketoconazole and advised to follow up with the podiatrist or primary care provider if not improving over the next week or so.   Medications - No data to display   Pertinent labs & imaging results that were available during my care of the patient were reviewed by me and considered in my medical decision making (see chart for details).  ____________________________________________   FINAL CLINICAL IMPRESSION(S) / ED DIAGNOSES  Final diagnoses:  Fissure in skin of foot    ED Discharge Orders        Ordered    ketoconazole (NIZORAL) 2 % cream  2 times daily PRN     05/12/18 2229       Note:  This document was prepared using Dragon voice recognition software and may include unintentional dictation errors.    Chinita Pesterriplett, Layonna Dobie B, FNP 05/12/18 2245    Dionne BucySiadecki, Sebastian, MD 05/12/18 2330

## 2018-05-12 NOTE — ED Triage Notes (Signed)
Pt c/o peeling on bilateral feet/toes x1 week. Pt has cracking in between toes and peeling noted to the bottom of feet.

## 2018-06-15 ENCOUNTER — Ambulatory Visit: Payer: Self-pay | Admitting: Internal Medicine

## 2018-06-16 DIAGNOSIS — B354 Tinea corporis: Secondary | ICD-10-CM

## 2018-06-16 NOTE — ED Provider Notes (Signed)
Independent MLP     HPI:  06/16/18,   Time: 10:22 PM         Michelle Downs is a 27 y.o. female presenting to the ED for rash, beginning few days ago.  The complaint has been constant, mild in severity, and worsened by nothing.  c/o rash to the abdomen and right lower leg, appears as tinea. Denies sob. Describes the rash as dry and pruritic    ROS:   Pertinent positives and negatives are stated within HPI, all other systems reviewed and are negative.  --------------------------------------------- PAST HISTORY ---------------------------------------------  Past Medical History:  has no past medical history on file.    Past Surgical History:  has no past surgical history on file.    Social History:  reports that she has never smoked. She does not have any smokeless tobacco history on file. She reports that she does not drink alcohol or use drugs.    Family History: family history is not on file.     The patient's home medications have been reviewed.    Allergies: Patient has no known allergies.    -------------------------------------------------- RESULTS -------------------------------------------------  All laboratory and radiology results have been personally reviewed by myself   LABS:  No results found for this visit on 06/16/18.    RADIOLOGY:  Interpreted by Radiologist.  No orders to display       ------------------------- NURSING NOTES AND VITALS REVIEWED ---------------------------   The nursing notes within the ED encounter and vital signs as below have been reviewed.   BP (!) 194/133   Pulse 95   Temp 98.2 F (36.8 C)   Resp 20   Ht 5\' 4"  (1.626 m)   Wt (!) 345 lb (156.5 kg)   SpO2 98%   BMI 59.22 kg/m   Oxygen Saturation Interpretation: Normal      ---------------------------------------------------PHYSICAL EXAM--------------------------------------      Constitutional/General: Alert and oriented x3, well appearing, non toxic in NAD  Head: NC/AT  Eyes: PERRL, EOMI  Mouth: Oropharynx clear, handling  secretions, no trismus  Neck: Supple, full ROM, no meningeal signs  Pulmonary: Lungs clear to auscultation bilaterally, no wheezes, rales, or rhonchi. Not in respiratory distress  Cardiovascular:  Regular rate and rhythm, no murmurs, gallops, or rubs. 2+ distal pulses  Abdomen: Soft, non tender, non distended,   Extremities: Moves all extremities x 4. Warm and well perfused  Skin: warm and dry with rash located around the umbilicus and to the right lower extremity which is dry, scaly, circular, erythematous and pruritic appearing rash.  Neurologic: GCS 15,  Psych: Normal Affect      ------------------------------ ED COURSE/MEDICAL DECISION MAKING----------------------  Medications - No data to display      Medical Decision Making:    Tinea corporis noted to the abdomen and right lower leg will be treated accordingly.  Advised to follow-up with primary care physician.  Noted her blood pressure is elevated and recommend following up regarding the elevated blood pressure.    Counseling:   The emergency provider has spoken with the patient and discussed today's results, in addition to providing specific details for the plan of care and counseling regarding the diagnosis and prognosis.  Questions are answered at this time and they are agreeable with the plan.      --------------------------------- IMPRESSION AND DISPOSITION ---------------------------------    IMPRESSION  1. Tinea corporis    2. Elevated blood pressure reading        DISPOSITION  Disposition: Discharge to home  Patient condition is good                 Merian Capron, APRN - CNP  06/17/18 [phone]

## 2018-06-17 ENCOUNTER — Inpatient Hospital Stay: Admit: 2018-06-17 | Discharge: 2018-06-17 | Disposition: A | Discharge status: Home / Self Care | Payer: Medicaid Other

## 2018-06-17 MED ORDER — KETOCONAZOLE 2 % EX CREA
2 % | CUTANEOUS | 1 refills | Status: DC
Start: 2018-06-17 — End: 2022-03-06

## 2018-07-21 ENCOUNTER — Ambulatory Visit: Payer: Medicaid Other | Admitting: Adult Health

## 2018-07-21 ENCOUNTER — Encounter: Payer: Self-pay | Admitting: Adult Health

## 2018-07-21 VITALS — BP 120/84 | HR 79 | Resp 16 | Ht 64.0 in | Wt 336.0 lb

## 2018-07-21 DIAGNOSIS — R0602 Shortness of breath: Secondary | ICD-10-CM | POA: Diagnosis not present

## 2018-07-21 DIAGNOSIS — G4733 Obstructive sleep apnea (adult) (pediatric): Secondary | ICD-10-CM

## 2018-07-21 NOTE — Progress Notes (Signed)
[facility] [address] [place], [place] [address]  Pulmonary Sleep Medicine   Office Visit Note  Patient Name: Pamela Benjamin DOB: May 03, 1991 MRN [id number]  Date of Service: 08/07/2018  Complaints/HPI: Pt here for follow up OSA.  Pt reports using cpap every night for at least 8 hours, most nights its more.  She reports good fit on her mask, and denies leak.  She denies daytime sleeping.  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  She denies hospitalizations since last visit.  No chest pain, sob, palpitations or other issues.  She reports she was seen in the [place] in [place] for ringworm recently.  She reports her BP 194/133.  She reports they did not do anything about the pressure but told her to follow up with her PCP.  Todays BP was WNL.    ROS  General: (-) fever, (-) chills, (-) night sweats, (-) weakness Skin: (-) rashes, (-) itching,. Eyes: (-) visual changes, (-) redness, (-) itching. Nose and Sinuses: (-) nasal stuffiness or itchiness, (-) postnasal drip, (-) nosebleeds, (-) sinus trouble. Mouth and Throat: (-) sore throat, (-) hoarseness. Neck: (-) swollen glands, (-) enlarged thyroid, (-) neck pain. Respiratory: - cough, (-) bloody sputum, - shortness of breath, - wheezing. Cardiovascular: - ankle swelling, (-) chest pain. Lymphatic: (-) lymph node enlargement. Neurologic: (-) numbness, (-) tingling. Psychiatric: (-) anxiety, (-) depression   Current Medication: Outpatient Encounter Medications as of 07/21/2018  Medication Sig  . albuterol (PROVENTIL HFA;VENTOLIN HFA) 108 (90 Base) MCG/ACT inhaler Inhale 2 puffs into the lungs.  . busPIRone (BUSPAR) 10 MG tablet Take by mouth.  . ciprofloxacin-dexamethasone (CIPRODEX) OTIC suspension Place 4 drops into both ears 2 (two) times daily. (Patient not taking: Reported on 07/31/2018)  . clindamycin (CLINDAGEL) 1 % gel Apply topically 2 (two) times daily as needed.  Marland Kitchen. EPINEPHrine 0.3 mg/0.3 mL IJ SOAJ injection Inject into  the muscle.  . ketoconazole (NIZORAL) 2 % cream Apply 1 application topically 2 (two) times daily as needed for irritation.  . methylphenidate 36 MG PO CR tablet Take by mouth.  . ondansetron (ZOFRAN) 4 MG tablet Take 1 tablet (4 mg total) by mouth every 8 (eight) hours as needed for nausea or vomiting.  . Oxcarbazepine (TRILEPTAL) 300 MG tablet Take 300 mg by mouth 2 (two) times daily.  Marland Kitchen. oxcarbazepine (TRILEPTAL) 600 MG tablet Take by mouth.  . phentermine (ADIPEX-P) 37.5 MG tablet Take 1 tablet (37.5 mg total) by mouth daily before breakfast.  . propranolol (INDERAL) 10 MG tablet Take by mouth.  . topiramate (TOPAMAX) 100 MG tablet Take 1/2 tab in the morning and 1 1/2 tab at night  . traZODone (DESYREL) 150 MG tablet Take by mouth.  . [DISCONTINUED] medroxyPROGESTERone (DEPO-PROVERA) 150 MG/ML injection Inject 150 mg into the muscle every 3 (three) months.  . fluticasone (FLONASE) 50 MCG/ACT nasal spray Place 1 spray into both nostrils daily.   No facility-administered encounter medications on file as of 07/21/2018.     Surgical History: History reviewed. No pertinent surgical history.  Medical History: Past Medical History:  Diagnosis Date  . ADHD (attention deficit hyperactivity disorder)   . Asthma   . Hypertension    Propanolol 2 pills bid, pt unaware of dose  . Mood disorder (HCC)   . Sleep apnea     Family History: Family History  Adopted: Yes  Family history unknown: Yes    Social History: Social History   Socioeconomic History  . Marital  status: Single    Spouse name: Not on file  . Number of children: Not on file  . Years of education: Not on file  . Highest education level: Not on file  Occupational History  . Not on file  Social Needs  . Financial resource strain: Not on file  . Food insecurity:    Worry: Not on file    Inability: Not on file  . Transportation needs:    Medical: Not on file    Non-medical: Not on file  Tobacco Use  . Smoking  status: Never Smoker  . Smokeless tobacco: Never Used  Substance and Sexual Activity  . Alcohol use: No  . Drug use: No  . Sexual activity: Not on file  Lifestyle  . Physical activity:    Days per week: Not on file    Minutes per session: Not on file  . Stress: Not on file  Relationships  . Social connections:    Talks on phone: Not on file    Gets together: Not on file    Attends religious service: Not on file    Active member of club or organization: Not on file    Attends meetings of clubs or organizations: Not on file    Relationship status: Not on file  . Intimate partner violence:    Fear of current or ex partner: Not on file    Emotionally abused: Not on file    Physically abused: Not on file    Forced sexual activity: Not on file  Other Topics Concern  . Not on file  Social History Narrative  . Not on file    Vital Signs: Blood pressure 120/84, pulse 79, resp. rate 16, height 5\' 4"  (1.626 m), weight (!) 336 lb (152.4 kg), SpO2 98 %.  Examination: General Appearance: The patient is well-developed, well-nourished, and in no distress. Skin: Gross inspection of skin unremarkable. Head: normocephalic, no gross deformities. Eyes: no gross deformities noted. ENT: ears appear grossly normal no exudates. Neck: Supple. No thyromegaly. No LAD. Respiratory: Clear to auscultation bilateraly. Cardiovascular: Normal S1 and S2 without murmur or rub. Extremities: No cyanosis. pulses are equal. Neurologic: Alert and oriented. No involuntary movements.  LABS: Recent Results (from the past 2160 hour(s))  POCT urine pregnancy     Status: None   Collection Time: 07/31/18 12:05 PM  Result Value Ref Range   Preg Test, Ur Negative Negative    Radiology: No results found.  No results found.  No results found.    Assessment and Plan: Patient Active Problem List   Diagnosis Date Noted  . Hypertension 12/23/2017  . Chronic tension-type headache, not intractable 07/01/2016   . Pain in throat 02/23/2015  . OSA on CPAP 02/02/2015  . Whole body pain 02/02/2015   1. Obstructive sleep apnea (adult) (pediatric) Continue CPAP compliance. Will follow up in 6 months.   2. Morbid obesity (HCC) Counciled patient on weight loss.  Obesity Counseling: Risk Assessment: An assessment of behavioral risk factors was made today and includes lack of exercise sedentary lifestyle, lack of portion control and poor dietary habits.  Risk Modification Advice: She was counseled on portion control guidelines. Restricting daily caloric intake to. . The detrimental long term effects of obesity on her health and ongoing poor compliance was also discussed with the patient.  3. Shortness of breath - Spirometry with Graph  general Counseling: I have discussed the findings of the evaluation and examination with Pamela Benjamin.  I have also discussed any  further diagnostic evaluation thatmay be needed or ordered today. Pamela Benjamin verbalizes understanding of the findings of todays visit. We also reviewed her medications today and discussed drug interactions and side effects including but not limited excessive drowsiness and altered mental states. We also discussed that there is always a risk not just to her but also people around her. she has been encouraged to call the office with any questions or concerns that should arise related to todays visit.    Time spent: 25 This patient was seen by Blima Ledger AGNP-C in Collaboration with Dr. Freda Munro as a part of collaborative care agreement.  I have personally obtained a history, examined the patient, evaluated laboratory and imaging results, formulated the assessment and plan and placed orders.    Yevonne Pax, MD [facility] Pulmonary and Critical Care Sleep medicine

## 2018-07-21 NOTE — Patient Instructions (Signed)

## 2018-07-22 ENCOUNTER — Other Ambulatory Visit: Payer: Self-pay | Admitting: Nurse Practitioner

## 2018-07-22 DIAGNOSIS — R110 Nausea: Secondary | ICD-10-CM

## 2018-07-22 MED ORDER — MEDROXYPROGESTERONE ACETATE 150 MG/ML IM SUSP: 150.0000 mg | INTRAMUSCULAR | 0 refills | Status: DC

## 2018-07-31 ENCOUNTER — Ambulatory Visit: Payer: Medicaid Other | Admitting: Nurse Practitioner

## 2018-07-31 ENCOUNTER — Encounter: Payer: Self-pay | Admitting: Nurse Practitioner

## 2018-07-31 VITALS — BP 136/90 | HR 81 | Resp 16 | Ht 64.0 in | Wt 340.2 lb

## 2018-07-31 DIAGNOSIS — Z23 Encounter for immunization: Secondary | ICD-10-CM

## 2018-07-31 DIAGNOSIS — R110 Nausea: Secondary | ICD-10-CM

## 2018-07-31 DIAGNOSIS — N921 Excessive and frequent menstruation with irregular cycle: Secondary | ICD-10-CM | POA: Diagnosis not present

## 2018-07-31 DIAGNOSIS — Z309 Encounter for contraceptive management, unspecified: Secondary | ICD-10-CM | POA: Diagnosis not present

## 2018-07-31 DIAGNOSIS — W57XXXA Bitten or stung by nonvenomous insect and other nonvenomous arthropods, initial encounter: Secondary | ICD-10-CM

## 2018-07-31 LAB — POCT URINE PREGNANCY: PREG TEST UR: NEGATIVE

## 2018-07-31 MED ORDER — MEDROXYPROGESTERONE ACETATE 150 MG/ML IM SUSP
150.0000 mg | Freq: Once | INTRAMUSCULAR | Status: AC
  Administered 2018-07-31: 150 mg via INTRAMUSCULAR

## 2018-07-31 MED ORDER — TRIAMCINOLONE ACETONIDE 0.025 % EX CREA: 1.0000 "application " | TOPICAL_CREAM | Freq: Two times a day (BID) | CUTANEOUS | 2 refills | Status: DC

## 2018-07-31 MED ORDER — MEDROXYPROGESTERONE ACETATE 150 MG/ML IM SUSP: 150.0000 mg | INTRAMUSCULAR | 0 refills | Status: DC

## 2018-07-31 NOTE — Progress Notes (Signed)
[facility] [address] [place], [place] [address]  Internal MEDICINE  Office Visit Note  Patient Name: Pamela Benjamin  [id number]  [id number]  Date of Service: 08/09/2018  Chief Complaint  Patient presents with  . Medical Management of Chronic Issues     discuss gynoclogist referral. pt also wants to know about birth control shot  . Contraception    depo shot  . Menstrual Problem    heavy menstrual cycle, constantly bleeding.   . Insect Bite    The patient states that, despite being on depo-provera injections, she is having heavy menstrual bleeding, which never really stops. She states that this has been going on for a couple of months. Continues to worsen. States that the past few weeks, she has noted passage of large clots with moderate to severe cramps.  She has also notices some itchy lesions which are present on both lower legs. Most are scabbed. Stretch from ankles to just below the knees.       Current Medication: Outpatient Encounter Medications as of 07/31/2018  Medication Sig  . albuterol (PROVENTIL HFA;VENTOLIN HFA) 108 (90 Base) MCG/ACT inhaler Inhale 2 puffs into the lungs.  . busPIRone (BUSPAR) 10 MG tablet Take by mouth.  . clindamycin (CLINDAGEL) 1 % gel Apply topically 2 (two) times daily as needed.  Marland Kitchen EPINEPHrine 0.3 mg/0.3 mL IJ SOAJ injection Inject into the muscle.  . ketoconazole (NIZORAL) 2 % cream Apply 1 application topically 2 (two) times daily as needed for irritation.  . medroxyPROGESTERone (DEPO-PROVERA) 150 MG/ML injection Inject 1 mL (150 mg total) into the muscle every 3 (three) months.  . methylphenidate 36 MG PO CR tablet Take by mouth.  . ondansetron (ZOFRAN) 4 MG tablet Take 1 tablet (4 mg total) by mouth every 8 (eight) hours as needed for nausea or vomiting.  . Oxcarbazepine (TRILEPTAL) 300 MG tablet Take 300 mg by mouth 2 (two) times daily.  Marland Kitchen oxcarbazepine (TRILEPTAL) 600 MG tablet Take by mouth.  . phentermine (ADIPEX-P)  37.5 MG tablet Take 1 tablet (37.5 mg total) by mouth daily before breakfast.  . propranolol (INDERAL) 10 MG tablet Take by mouth.  . topiramate (TOPAMAX) 100 MG tablet Take 1/2 tab in the morning and 1 1/2 tab at night  . traZODone (DESYREL) 150 MG tablet Take by mouth.  . [DISCONTINUED] medroxyPROGESTERone (DEPO-PROVERA) 150 MG/ML injection Inject 1 mL (150 mg total) into the muscle every 3 (three) months.  . ciprofloxacin-dexamethasone (CIPRODEX) OTIC suspension Place 4 drops into both ears 2 (two) times daily. (Patient not taking: Reported on 07/31/2018)  . fluticasone (FLONASE) 50 MCG/ACT nasal spray Place 1 spray into both nostrils daily.  Marland Kitchen triamcinolone (KENALOG) 0.025 % cream Apply 1 application topically 2 (two) times daily.  . [EXPIRED] medroxyPROGESTERone (DEPO-PROVERA) injection 150 mg    No facility-administered encounter medications on file as of 07/31/2018.     Surgical History: History reviewed. No pertinent surgical history.  Medical History: Past Medical History:  Diagnosis Date  . ADHD (attention deficit hyperactivity disorder)   . Asthma   . Hypertension    Propanolol 2 pills bid, pt unaware of dose  . Mood disorder (HCC)   . Sleep apnea     Family History: Family History  Adopted: Yes  Family history unknown: Yes    Social History   Socioeconomic History  . Marital status: Single    Spouse name: Not on file  . Number of children: Not on file  . Years of  education: Not on file  . Highest education level: Not on file  Occupational History  . Not on file  Social Needs  . Financial resource strain: Not on file  . Food insecurity:    Worry: Not on file    Inability: Not on file  . Transportation needs:    Medical: Not on file    Non-medical: Not on file  Tobacco Use  . Smoking status: Never Smoker  . Smokeless tobacco: Never Used  Substance and Sexual Activity  . Alcohol use: No  . Drug use: No  . Sexual activity: Not on file  Lifestyle  .  Physical activity:    Days per week: Not on file    Minutes per session: Not on file  . Stress: Not on file  Relationships  . Social connections:    Talks on phone: Not on file    Gets together: Not on file    Attends religious service: Not on file    Active member of club or organization: Not on file    Attends meetings of clubs or organizations: Not on file    Relationship status: Not on file  . Intimate partner violence:    Fear of current or ex partner: Not on file    Emotionally abused: Not on file    Physically abused: Not on file    Forced sexual activity: Not on file  Other Topics Concern  . Not on file  Social History Narrative  . Not on file      Review of Systems  Constitutional: Negative for activity change, appetite change, fatigue and unexpected weight change.       Weight loss of 5 pounds since her last visit .  HENT: Negative for congestion, ear pain, postnasal drip, rhinorrhea, sinus pressure, sinus pain and voice change.   Eyes: Negative.   Respiratory: Negative for cough, shortness of breath and wheezing.   Cardiovascular: Negative for chest pain and palpitations.  Gastrointestinal: Negative for constipation, diarrhea, nausea and vomiting.  Endocrine: Negative for cold intolerance, heat intolerance, polydipsia, polyphagia and polyuria.  Genitourinary: Positive for menstrual problem and vaginal bleeding.       Having her period nearly all the time with heavy menstrual bleeding with moderate to severe cramping.   Musculoskeletal: Negative for arthralgias.  Skin: Positive for rash.       Multiple itchy lesions to the lower aspects of both legs.   Allergic/Immunologic: Positive for environmental allergies.  Neurological: Negative for dizziness, seizures and headaches.  Hematological: Negative.   Psychiatric/Behavioral: Positive for agitation and behavioral problems. The patient is nervous/anxious.        Patient treated per psychiatry    Today's Vitals    07/31/18 1112  BP: 136/90  Pulse: 81  Resp: 16  SpO2: 95%  Weight: (!) 340 lb 3.2 oz (154.3 kg)  Height: 5\' 4"  (1.626 m)    Physical Exam  Constitutional: She is oriented to person, place, and time. She appears well-developed and well-nourished. No distress.  HENT:  Head: Normocephalic and atraumatic.  Right Ear: Tympanic membrane is erythematous and bulging.  Left Ear: Tympanic membrane is erythematous and bulging.  Nose: Nose normal.  Mouth/Throat: Oropharynx is clear and moist. No oropharyngeal exudate.  Eyes: Pupils are equal, round, and reactive to light. Conjunctivae and EOM are normal.  Neck: Normal range of motion. Neck supple. No JVD present. No tracheal deviation present. No thyromegaly present.  Cardiovascular: Normal rate, regular rhythm and normal heart sounds. Exam  reveals no gallop and no friction rub.  No murmur heard. Pulmonary/Chest: Effort normal and breath sounds normal. No respiratory distress. She has no wheezes. She has no rales. She exhibits no tenderness.  Abdominal: Soft. Bowel sounds are normal. There is no tenderness.  Genitourinary:  Genitourinary Comments: Urine pregnancy test negative.   Musculoskeletal: Normal range of motion.  Lymphadenopathy:    She has no cervical adenopathy.  Neurological: She is alert and oriented to person, place, and time. No cranial nerve deficit.  Skin: Skin is warm and dry. She is not diaphoretic.  Multiple insect bites located on the lower aspects of both legs. Many bites are scabbed over. All show evidence of inflammation without infection. No drainage present from any of the lesions.   Psychiatric: She has a normal mood and affect. Her behavior is normal. Judgment and thought content normal.  Nursing note and vitals reviewed.  Assessment/Plan: 1. Menorrhagia with irregular cycle Heavy periods with cramping present despite regularly taking depo-provera injections. Will refer to GYN for further evaluation and treatment.   - Ambulatory referral to Gynecology  2. Encounter for contraceptive management, unspecified type Pregnancy test negative. Patient received depo-provera injection.  - POCT urine pregnancy - medroxyPROGESTERone (DEPO-PROVERA) injection 150 mg  3. Mosquito bite, initial encounter Add triamcinolone cream which may be applied to all affected areas twice daily as needed.  - triamcinolone (KENALOG) 0.025 % cream; Apply 1 application topically 2 (two) times daily.  Dispense: 80 g; Refill: 2  4. Needs flu shot - Flu Vaccine MDCK QUAD PF  General Counseling: Rocklyn verbalizes understanding of the findings of todays visit and agrees with plan of treatment. I have discussed any further diagnostic evaluation that may be needed or ordered today. We also reviewed her medications today. she has been encouraged to call the office with any questions or concerns that should arise related to todays visit.  This patient was seen by Vincent Gros FNP Collaboration with Dr Lyndon Code as a part of collaborative care agreement  Orders Placed This Encounter  Procedures  . Flu Vaccine MDCK QUAD PF  . Ambulatory referral to Gynecology  . POCT urine pregnancy    Meds ordered this encounter  Medications  . triamcinolone (KENALOG) 0.025 % cream    Sig: Apply 1 application topically 2 (two) times daily.    Dispense:  80 g    Refill:  2    Order Specific Question:   Supervising Provider    Answer:   Lyndon Code [[id number]]  . medroxyPROGESTERone (DEPO-PROVERA) 150 MG/ML injection    Sig: Inject 1 mL (150 mg total) into the muscle every 3 (three) months.    Dispense:  1 mL    Refill:  0    Patient to call when she needs this. Will be 3 months.    Order Specific Question:   Supervising Provider    Answer:   Lyndon Code [[id number]]  . medroxyPROGESTERone (DEPO-PROVERA) injection 150 mg    Time spent: 25 Minutes      Dr Lyndon Code Internal medicine

## 2018-08-09 DIAGNOSIS — N921 Excessive and frequent menstruation with irregular cycle: Secondary | ICD-10-CM | POA: Insufficient documentation

## 2018-08-09 DIAGNOSIS — W57XXXA Bitten or stung by nonvenomous insect and other nonvenomous arthropods, initial encounter: Secondary | ICD-10-CM | POA: Insufficient documentation

## 2018-08-09 DIAGNOSIS — R110 Nausea: Secondary | ICD-10-CM | POA: Insufficient documentation

## 2018-08-09 DIAGNOSIS — Z23 Encounter for immunization: Secondary | ICD-10-CM | POA: Insufficient documentation

## 2018-08-09 DIAGNOSIS — Z309 Encounter for contraceptive management, unspecified: Secondary | ICD-10-CM | POA: Insufficient documentation

## 2018-08-09 DIAGNOSIS — Z3202 Encounter for pregnancy test, result negative: Secondary | ICD-10-CM | POA: Insufficient documentation

## 2018-08-24 ENCOUNTER — Emergency Department: Payer: Medicaid Other

## 2018-08-24 ENCOUNTER — Other Ambulatory Visit: Payer: Self-pay

## 2018-08-24 ENCOUNTER — Emergency Department
Admission: EM | Admit: 2018-08-24 | Discharge: 2018-08-24 | Disposition: A | Discharge status: Home / Self Care | Payer: Medicaid Other | Attending: Emergency Medicine | Admitting: Emergency Medicine

## 2018-08-24 DIAGNOSIS — W25XXXA Contact with sharp glass, initial encounter: Secondary | ICD-10-CM | POA: Insufficient documentation

## 2018-08-24 DIAGNOSIS — Y998 Other external cause status: Secondary | ICD-10-CM | POA: Diagnosis not present

## 2018-08-24 DIAGNOSIS — Y939 Activity, unspecified: Secondary | ICD-10-CM | POA: Insufficient documentation

## 2018-08-24 DIAGNOSIS — Z23 Encounter for immunization: Secondary | ICD-10-CM | POA: Diagnosis not present

## 2018-08-24 DIAGNOSIS — Z79899 Other long term (current) drug therapy: Secondary | ICD-10-CM | POA: Diagnosis not present

## 2018-08-24 DIAGNOSIS — J45909 Unspecified asthma, uncomplicated: Secondary | ICD-10-CM | POA: Diagnosis not present

## 2018-08-24 DIAGNOSIS — Y929 Unspecified place or not applicable: Secondary | ICD-10-CM | POA: Insufficient documentation

## 2018-08-24 DIAGNOSIS — S90811A Abrasion, right foot, initial encounter: Secondary | ICD-10-CM | POA: Diagnosis not present

## 2018-08-24 DIAGNOSIS — Z711 Person with feared health complaint in whom no diagnosis is made: Secondary | ICD-10-CM

## 2018-08-24 DIAGNOSIS — S91341A Puncture wound with foreign body, right foot, initial encounter: Secondary | ICD-10-CM | POA: Diagnosis present

## 2018-08-24 DIAGNOSIS — I10 Essential (primary) hypertension: Secondary | ICD-10-CM | POA: Diagnosis not present

## 2018-08-24 MED ORDER — TETANUS-DIPHTH-ACELL PERTUSSIS 5-2.5-18.5 LF-MCG/0.5 IM SUSP
0.5000 mL | Freq: Once | INTRAMUSCULAR | Status: AC
  Administered 2018-08-24: 0.5 mL via INTRAMUSCULAR
  Filled 2018-08-24: qty 0.5

## 2018-08-24 NOTE — ED Notes (Signed)
Pt states that she stepped on a piece of glass and went through the middle of her shoe and into her foot. Pt states that she pulled the piece of glass out and is just worried that there is something left in her foot.

## 2018-08-24 NOTE — ED Triage Notes (Signed)
Patient reports stepped on a piece of glass.  Reports pulled a large piece out, but wanted to make sure she had gotten it all.

## 2018-08-24 NOTE — ED Provider Notes (Signed)
[facility] Emergency Department Provider Note  ____________________________________________  Time seen: Approximately 9:43 PM  I have reviewed the triage vital signs and the nursing notes.   HISTORY  Chief Complaint Foreign Body    HPI Pamela Benjamin is a 27 y.o. female presents to the emergency department with concern for  right foot foreign body.  Patient stepped on a piece of glass.  Patient removed glass but is concerned that glass fragment remains.  No numbness or tingling of the right foot.  No alleviating measures have been attempted.   Past Medical History:  Diagnosis Date  . ADHD (attention deficit hyperactivity disorder)   . Asthma   . Hypertension    Propanolol 2 pills bid, pt unaware of dose  . Mood disorder (HCC)   . Sleep apnea     Patient Active Problem List   Diagnosis Date Noted  . Menorrhagia with irregular cycle 08/09/2018  . Encounter for pregnancy test, result negative 08/09/2018  . Encounter for contraceptive management 08/09/2018  . Mosquito bite 08/09/2018  . Nausea without vomiting 08/09/2018  . Needs flu shot 08/09/2018  . Hypertension 12/23/2017  . Chronic tension-type headache, not intractable 07/01/2016  . Pain in throat 02/23/2015  . OSA on CPAP 02/02/2015  . Whole body pain 02/02/2015    No past surgical history on file.  Prior to Admission medications   Medication Sig Start Date End Date Taking? Authorizing Provider  albuterol (PROVENTIL HFA;VENTOLIN HFA) 108 (90 Base) MCG/ACT inhaler Inhale 2 puffs into the lungs.    [provider]  busPIRone (BUSPAR) 10 MG tablet Take by mouth.    [provider]  ciprofloxacin-dexamethasone (CIPRODEX) OTIC suspension Place 4 drops into both ears 2 (two) times daily. Patient not taking: Reported on 07/31/2018 01/09/18   Carlean Jews, NP  clindamycin (CLINDAGEL) 1 % gel Apply topically 2 (two) times daily as needed.    [provider]   EPINEPHrine 0.3 mg/0.3 mL IJ SOAJ injection Inject into the muscle.    [provider]  fluticasone (FLONASE) 50 MCG/ACT nasal spray Place 1 spray into both nostrils daily. 08/03/17 08/08/17  Orvil Feil, PA-C  ketoconazole (NIZORAL) 2 % cream Apply 1 application topically 2 (two) times daily as needed for irritation. 05/12/18   Triplett, Rulon Eisenmenger B, FNP  medroxyPROGESTERone (DEPO-PROVERA) 150 MG/ML injection Inject 1 mL (150 mg total) into the muscle every 3 (three) months. 07/31/18   Carlean Jews, NP  methylphenidate 36 MG PO CR tablet Take by mouth.    [provider]  ondansetron (ZOFRAN) 4 MG tablet Take 1 tablet (4 mg total) by mouth every 8 (eight) hours as needed for nausea or vomiting. 08/11/16   Jeanmarie Plant, MD  Oxcarbazepine (TRILEPTAL) 300 MG tablet Take 300 mg by mouth 2 (two) times daily.    [provider]  oxcarbazepine (TRILEPTAL) 600 MG tablet Take by mouth.    [provider]  phentermine (ADIPEX-P) 37.5 MG tablet Take 1 tablet (37.5 mg total) by mouth daily before breakfast. 01/09/18   Carlean Jews, NP  propranolol (INDERAL) 10 MG tablet Take by mouth.    [provider]  topiramate (TOPAMAX) 100 MG tablet Take 1/2 tab in the morning and 1 1/2 tab at night 04/17/16   [provider]  traZODone (DESYREL) 150 MG tablet Take by mouth.    [provider]  triamcinolone (KENALOG) 0.025 % cream Apply 1 application topically 2 (two) times daily. 07/31/18  Carlean Jews, NP    Allergies Other; Pollen extract; and Latex  Family History  Adopted: Yes  Family history unknown: Yes    Social History Social History   Tobacco Use  . Smoking status: Never Smoker  . Smokeless tobacco: Never Used  Substance Use Topics  . Alcohol use: No  . Drug use: No     Review of Systems  Constitutional: No fever/chills Eyes: No visual changes. No discharge ENT: No upper respiratory complaints. Cardiovascular: no  chest pain. Respiratory: no cough. No SOB. Gastrointestinal: No abdominal pain.  No nausea, no vomiting.  No diarrhea.  No constipation. Genitourinary: Negative for dysuria. No hematuria Musculoskeletal: Negative for musculoskeletal pain. Skin: Patient has feared right foot foreign body.  Neurological: Negative for headaches, focal weakness or numbness.   ____________________________________________   PHYSICAL EXAM:  VITAL SIGNS: ED Triage Vitals  Enc Vitals Group     BP 08/24/18 2009 137/88     Pulse Rate 08/24/18 2139 69     Resp 08/24/18 2009 18     Temp 08/24/18 2009 98.1 F (36.7 C)     Temp Source 08/24/18 2009 Oral     SpO2 08/24/18 2009 97 %     Weight 08/24/18 2007 (!) 345 lb (156.5 kg)     Height 08/24/18 2007 5\' 4"  (1.626 m)     Head Circumference --      Peak Flow --      Pain Score 08/24/18 2007 8     Pain Loc --      Pain Edu? --      Excl. in GC? --      Constitutional: Alert and oriented. Well appearing and in no acute distress. Eyes: Conjunctivae are normal. PERRL. EOMI. Head: Atraumatic. Cardiovascular: Normal rate, regular rhythm. Normal S1 and S2.  Good peripheral circulation. Respiratory: Normal respiratory effort without tachypnea or retractions. Lungs CTAB. Good air entry to the bases with no decreased or absent breath sounds. Musculoskeletal: Full range of motion to all extremities. No gross deformities appreciated. Neurologic:  Normal speech and language. No gross focal neurologic deficits are appreciated.  Skin: Patient has abrasion along right foot at site where glass was supposed to be.  No laceration. Psychiatric: Mood and affect are normal. Speech and behavior are normal. Patient exhibits appropriate insight and judgement.   ____________________________________________   LABS (all labs ordered are listed, but only abnormal results are displayed)  Labs Reviewed - No data to  display ____________________________________________  EKG   ____________________________________________  RADIOLOGY I personally viewed and evaluated these images as part of my medical decision making, as well as reviewing the written report by the radiologist.  Dg Foot Complete Right  Result Date: 08/24/2018 CLINICAL DATA:  Right foot pain/injury, laceration with glass EXAM: RIGHT FOOT COMPLETE - 3+ VIEW COMPARISON:  None. FINDINGS: No fracture or dislocation is seen. The joint spaces are preserved. Visualized soft tissues are within normal limits. No radiopaque foreign body is seen. IMPRESSION: No fracture, dislocation, or radiopaque foreign body is seen. Electronically Signed   By: Charline Bills M.D.   On: 08/24/2018 20:41    ____________________________________________    PROCEDURES  Procedure(s) performed:    Procedures    Medications  Tdap (BOOSTRIX) injection 0.5 mL (0.5 mLs Intramuscular Given 08/24/18 2044)     ____________________________________________   INITIAL IMPRESSION / ASSESSMENT AND PLAN / ED COURSE  Pertinent labs & imaging results that were available during my care of the patient were reviewed by  me and considered in my medical decision making (see chart for details).  Review of the [place] CSRS was performed in accordance of the [facility] prior to dispensing any controlled drugs.      Assessment and plan Feared complaint without diagnosis Patient presents to the emergency department with concern for right foot foreign body after stepping on a piece of glass.  Overall physical exam was reassuring.  X-ray examination of the right foot revealed no retained foreign bodies.  Patient's tetanus status was updated in the emergency department.  Ibuprofen was recommended for pain.  All patient questions were answered.    ____________________________________________  FINAL CLINICAL IMPRESSION(S) / ED DIAGNOSES  Final diagnoses:  Feared complaint without  diagnosis      NEW MEDICATIONS STARTED DURING THIS VISIT:  ED Discharge Orders    None          This chart was dictated using voice recognition software/Dragon. Despite best efforts to proofread, errors can occur which can change the meaning. Any change was purely unintentional.    Orvil Feil, PA-C 08/24/18 2148    Nita Sickle, MD 08/29/18 [phone]

## 2018-08-25 ENCOUNTER — Encounter: Payer: Self-pay | Admitting: Certified Nurse Midwife

## 2018-08-25 ENCOUNTER — Ambulatory Visit: Payer: Medicaid Other | Admitting: Certified Nurse Midwife

## 2018-08-25 VITALS — BP 127/81 | HR 79 | Ht 64.0 in | Wt 339.2 lb

## 2018-08-25 DIAGNOSIS — N939 Abnormal uterine and vaginal bleeding, unspecified: Secondary | ICD-10-CM

## 2018-08-25 NOTE — Progress Notes (Signed)
GYN ENCOUNTER NOTE  Subjective:       Pamela Benjamin is a 27 y.o. No obstetric history on file. female is here for gynecologic evaluation of the following issues:  1. Abnormal Uterine bleeding on depo provera injections. She has been on Depo x 8 yrs and states she has been having heavy bleeding x 3 months with passing of blood clots. She is not sexually active and denies possibility of STI or infection.    Gynecologic History No LMP recorded. Patient has had an injection. Contraception: Depo-Provera injections Last Pap: with in the past 3 yrs . Results were: normal per pt.  Last mammogram: N/A   Obstetric History OB History  No data available    Past Medical History:  Diagnosis Date  . ADHD (attention deficit hyperactivity disorder)   . Asthma   . Hypertension    Propanolol 2 pills bid, pt unaware of dose  . Mood disorder (HCC)   . Sleep apnea     No past surgical history on file.  Current Outpatient Medications on File Prior to Visit  Medication Sig Dispense Refill  . albuterol (PROVENTIL HFA;VENTOLIN HFA) 108 (90 Base) MCG/ACT inhaler Inhale 2 puffs into the lungs.    . busPIRone (BUSPAR) 10 MG tablet Take by mouth.    . ciprofloxacin-dexamethasone (CIPRODEX) OTIC suspension Place 4 drops into both ears 2 (two) times daily. (Patient not taking: Reported on 07/31/2018) 7.5 mL 0  . clindamycin (CLINDAGEL) 1 % gel Apply topically 2 (two) times daily as needed.    Marland Kitchen EPINEPHrine 0.3 mg/0.3 mL IJ SOAJ injection Inject into the muscle.    . fluticasone (FLONASE) 50 MCG/ACT nasal spray Place 1 spray into both nostrils daily. 16 g 1  . ketoconazole (NIZORAL) 2 % cream Apply 1 application topically 2 (two) times daily as needed for irritation. 15 g 0  . medroxyPROGESTERone (DEPO-PROVERA) 150 MG/ML injection Inject 1 mL (150 mg total) into the muscle every 3 (three) months. 1 mL 0  . methylphenidate 36 MG PO CR tablet Take by mouth.    . ondansetron (ZOFRAN) 4 MG tablet Take 1 tablet  (4 mg total) by mouth every 8 (eight) hours as needed for nausea or vomiting. 8 tablet 0  . Oxcarbazepine (TRILEPTAL) 300 MG tablet Take 300 mg by mouth 2 (two) times daily.    Marland Kitchen oxcarbazepine (TRILEPTAL) 600 MG tablet Take by mouth.    . phentermine (ADIPEX-P) 37.5 MG tablet Take 1 tablet (37.5 mg total) by mouth daily before breakfast. 30 tablet 0  . propranolol (INDERAL) 10 MG tablet Take by mouth.    . topiramate (TOPAMAX) 100 MG tablet Take 1/2 tab in the morning and 1 1/2 tab at night    . traZODone (DESYREL) 150 MG tablet Take by mouth.    . triamcinolone (KENALOG) 0.025 % cream Apply 1 application topically 2 (two) times daily. 80 g 2   No current facility-administered medications on file prior to visit.     Allergies  Allergen Reactions  . Other Other (See Comments)    Maple tree, dust, and rag weed  . Pollen Extract Other (See Comments)  . Latex Rash    Breaks out    Social History   Socioeconomic History  . Marital status: Single    Spouse name: Not on file  . Number of children: Not on file  . Years of education: Not on file  . Highest education level: Not on file  Occupational History  . Not  on file  Social Needs  . Financial resource strain: Not on file  . Food insecurity:    Worry: Not on file    Inability: Not on file  . Transportation needs:    Medical: Not on file    Non-medical: Not on file  Tobacco Use  . Smoking status: Never Smoker  . Smokeless tobacco: Never Used  Substance and Sexual Activity  . Alcohol use: No  . Drug use: No  . Sexual activity: Not on file  Lifestyle  . Physical activity:    Days per week: Not on file    Minutes per session: Not on file  . Stress: Not on file  Relationships  . Social connections:    Talks on phone: Not on file    Gets together: Not on file    Attends religious service: Not on file    Active member of club or organization: Not on file    Attends meetings of clubs or organizations: Not on file     Relationship status: Not on file  . Intimate partner violence:    Fear of current or ex partner: Not on file    Emotionally abused: Not on file    Physically abused: Not on file    Forced sexual activity: Not on file  Other Topics Concern  . Not on file  Social History Narrative  . Not on file    Family History  Adopted: Yes  Family history unknown: Yes    The following portions of the patient's history were reviewed and updated as appropriate: allergies, current medications, past family history, past medical history, past social history, past surgical history and problem list.  Review of Systems Review of Systems - Negative except HPI  Review of Systems - General ROS: negative for - chills, fatigue, fever, hot flashes, malaise or night sweats Hematological and Lymphatic ROS: negative for - bleeding problems or swollen lymph nodes Gastrointestinal ROS: negative for - abdominal pain, blood in stools, change in bowel habits and nausea/vomiting Musculoskeletal ROS: negative for - joint pain, muscle pain or muscular weakness Genito-Urinary ROS: negative for -  dyspareunia, dysuria, genital discharge, genital ulcers, hematuria, incontinence, irregular/heavy menses, nocturia or pelvic pain. Positive for  change in menstrual cycle, dysmenorrhea.  Objective:   BP 127/81   Pulse 79   Ht 5\' 4"  (1.626 m)   Wt (!) 339 lb 4 oz (153.9 kg)   BMI 58.23 kg/m  CONSTITUTIONAL: Well-developed, well-nourished, morbidly obese female in no acute distress.  HENT:  Normocephalic, atraumatic.  NECK: Normal range of motion, supple, no masses.  Normal thyroid.  SKIN: Skin is warm and dry. No rash noted. Not diaphoretic. No erythema. No pallor. NEUROLGIC: Alert and oriented to person, place, and time.  PSYCHIATRIC: Normal mood and affect. Normal behavior. Normal judgment and thought content. CARDIOVASCULAR:Not Examined RESPIRATORY: Not Examined BREASTS: Not Examined ABDOMEN: Soft, non distended; Non  tender.  No Organomegaly. PELVIC:  External Genitalia: Normal  BUS: Normal  Vagina: Normal  Cervix: Normal, no polyps seen   Uterus: unable to assess due to body habitus  Adnexa: lunable to assess due to body habitus  ZO:[id number] to assess due to body habitus  Bladder: Nontender MUSCULOSKELETAL: Normal range of motion. No tenderness.  No cyanosis, clubbing, or edema   Assessment:   Abnormal uterine bleeding   Plan:   U/S pelvic to r/o fibroid, Discussed use of NSAID x 5 days to decrease bleeding, use of lysteda, or change of BC to IUD.  She plans to try the NSAIDs, she will let me know if it is not working then will try Lysteda. Will follow up with u/s results. Return for ultrasound at earliest availability.    Doreene Burke, CNM

## 2018-08-25 NOTE — Patient Instructions (Signed)

## 2018-08-26 ENCOUNTER — Ambulatory Visit (INDEPENDENT_AMBULATORY_CARE_PROVIDER_SITE_OTHER): Payer: Medicaid Other

## 2018-08-26 DIAGNOSIS — N939 Abnormal uterine and vaginal bleeding, unspecified: Secondary | ICD-10-CM | POA: Diagnosis not present

## 2018-10-21 ENCOUNTER — Ambulatory Visit: Payer: Self-pay

## 2018-10-28 ENCOUNTER — Ambulatory Visit: Payer: Self-pay

## 2018-10-30 ENCOUNTER — Ambulatory Visit: Payer: Self-pay | Admitting: Nurse Practitioner

## 2019-01-22 ENCOUNTER — Ambulatory Visit: Payer: Self-pay | Admitting: Adult Health

## 2019-06-25 ENCOUNTER — Other Ambulatory Visit: Payer: Self-pay

## 2019-06-25 ENCOUNTER — Encounter: Payer: Self-pay | Admitting: Certified Nurse Midwife

## 2019-06-25 ENCOUNTER — Ambulatory Visit (INDEPENDENT_AMBULATORY_CARE_PROVIDER_SITE_OTHER): Payer: Medicaid Other | Admitting: Certified Nurse Midwife

## 2019-06-25 VITALS — BP 125/79 | HR 88 | Ht 64.0 in | Wt 321.7 lb

## 2019-06-25 DIAGNOSIS — N912 Amenorrhea, unspecified: Secondary | ICD-10-CM

## 2019-06-25 DIAGNOSIS — Z3201 Encounter for pregnancy test, result positive: Secondary | ICD-10-CM | POA: Diagnosis not present

## 2019-06-25 DIAGNOSIS — O99211 Obesity complicating pregnancy, first trimester: Secondary | ICD-10-CM

## 2019-06-25 DIAGNOSIS — O99210 Obesity complicating pregnancy, unspecified trimester: Secondary | ICD-10-CM | POA: Insufficient documentation

## 2019-06-25 LAB — POCT URINE PREGNANCY: Preg Test, Ur: POSITIVE — AB

## 2019-06-25 NOTE — Progress Notes (Signed)
Patient here for pregnancy confirmation.  No complaints.  

## 2019-06-25 NOTE — Patient Instructions (Signed)

## 2019-06-25 NOTE — Progress Notes (Signed)
Subjective:    AKAYLAH LALLEY is a 28 y.o. female who presents for evaluation of amenorrhea. She believes she could be pregnant. Pregnancy is desired. Sexual Activity: single partner, contraception: none. Current symptoms also include: breast tenderness. Last period was normal.   Body mass index is 55.22 kg/m.  Patient's last menstrual period was 03/25/2019 (exact date). The following portions of the patient's history were reviewed and updated as appropriate: allergies, current medications, past family history, past medical history, past social history, past surgical history and problem list.  Review of Systems Pertinent items are noted in HPI.     Objective:    BP 125/79   Pulse 88   Ht 5\' 4"  (1.626 m)   Wt (!) 321 lb 11.2 oz (145.9 kg)   LMP 03/25/2019 (Exact Date)   BMI 55.22 kg/m  General: alert, cooperative, appears stated age and no acute distress    Lab Review Urine HCG: positive    Assessment:    Absence of menstruation.     Plan:  Positive: EDC: .12/30/19 Briefly discussed pre-natal care options. Discussed need MD care due to risking out of midwifery care. She verbalizes and agrees to plan.  Encouraged well-balanced diet,  plenty of rest when needed, pre-natal vitamins daily and walking for exercise. Discussed self-help for nausea, avoiding OTC medications until consulting provider or pharmacist, other than Tylenol as needed, minimal caffeine (1-2 cups daily) and avoiding alcohol. She will schedule u/s for next available and NOB with MD next week.   Philip Aspen, CNM

## 2019-06-30 ENCOUNTER — Other Ambulatory Visit: Payer: Medicaid Other

## 2019-07-22 LAB — OB RESULTS CONSOLE VARICELLA ZOSTER ANTIBODY, IGG: Varicella: IMMUNE

## 2019-07-22 LAB — OB RESULTS CONSOLE GC/CHLAMYDIA
Chlamydia: NEGATIVE
Gonorrhea: NEGATIVE

## 2019-07-22 LAB — OB RESULTS CONSOLE RPR: RPR: NONREACTIVE

## 2019-07-22 LAB — OB RESULTS CONSOLE HIV ANTIBODY (ROUTINE TESTING): HIV: NONREACTIVE

## 2019-07-22 LAB — OB RESULTS CONSOLE HEPATITIS B SURFACE ANTIGEN: Hepatitis B Surface Ag: NEGATIVE

## 2019-07-22 LAB — OB RESULTS CONSOLE RUBELLA ANTIBODY, IGM: Rubella: IMMUNE

## 2019-07-25 ENCOUNTER — Other Ambulatory Visit: Payer: Self-pay

## 2019-07-25 ENCOUNTER — Emergency Department
Admission: EM | Admit: 2019-07-25 | Discharge: 2019-07-25 | Disposition: A | Discharge status: Home / Self Care | Payer: Medicaid Other | Attending: Emergency Medicine | Admitting: Emergency Medicine

## 2019-07-25 DIAGNOSIS — N390 Urinary tract infection, site not specified: Secondary | ICD-10-CM

## 2019-07-25 DIAGNOSIS — Z3A14 14 weeks gestation of pregnancy: Secondary | ICD-10-CM | POA: Diagnosis not present

## 2019-07-25 DIAGNOSIS — I10 Essential (primary) hypertension: Secondary | ICD-10-CM | POA: Diagnosis not present

## 2019-07-25 DIAGNOSIS — R6883 Chills (without fever): Secondary | ICD-10-CM

## 2019-07-25 DIAGNOSIS — Z79899 Other long term (current) drug therapy: Secondary | ICD-10-CM | POA: Insufficient documentation

## 2019-07-25 DIAGNOSIS — O2342 Unspecified infection of urinary tract in pregnancy, second trimester: Secondary | ICD-10-CM | POA: Diagnosis not present

## 2019-07-25 DIAGNOSIS — F909 Attention-deficit hyperactivity disorder, unspecified type: Secondary | ICD-10-CM | POA: Diagnosis not present

## 2019-07-25 DIAGNOSIS — Z91040 Latex allergy status: Secondary | ICD-10-CM | POA: Diagnosis not present

## 2019-07-25 DIAGNOSIS — Z87891 Personal history of nicotine dependence: Secondary | ICD-10-CM | POA: Diagnosis not present

## 2019-07-25 LAB — CBC WITH DIFFERENTIAL/PLATELET
Abs Immature Granulocytes: 0.03 10*3/uL (ref 0.00–0.07)
Basophils Absolute: 0 10*3/uL (ref 0.0–0.1)
Basophils Relative: 0 %
Eosinophils Absolute: 0.3 10*3/uL (ref 0.0–0.5)
Eosinophils Relative: 3 %
HCT: 37.5 % (ref 36.0–46.0)
Hemoglobin: 12.9 g/dL (ref 12.0–15.0)
Immature Granulocytes: 0 %
Lymphocytes Relative: 24 %
Lymphs Abs: 2.4 10*3/uL (ref 0.7–4.0)
MCH: 31.1 pg (ref 26.0–34.0)
MCHC: 34.4 g/dL (ref 30.0–36.0)
MCV: 90.4 fL (ref 80.0–100.0)
Monocytes Absolute: 0.7 10*3/uL (ref 0.1–1.0)
Monocytes Relative: 7 %
Neutro Abs: 6.5 10*3/uL (ref 1.7–7.7)
Neutrophils Relative %: 66 %
Platelets: 260 10*3/uL (ref 150–400)
RBC: 4.15 MIL/uL (ref 3.87–5.11)
RDW: 12 % (ref 11.5–15.5)
WBC: 9.9 10*3/uL (ref 4.0–10.5)
nRBC: 0 % (ref 0.0–0.2)

## 2019-07-25 LAB — URINALYSIS, COMPLETE (UACMP) WITH MICROSCOPIC
Bilirubin Urine: NEGATIVE
Glucose, UA: NEGATIVE mg/dL
Hgb urine dipstick: NEGATIVE
Ketones, ur: NEGATIVE mg/dL
Nitrite: NEGATIVE
Protein, ur: NEGATIVE mg/dL
Specific Gravity, Urine: 1.027 (ref 1.005–1.030)
pH: 5 (ref 5.0–8.0)

## 2019-07-25 LAB — BASIC METABOLIC PANEL
Anion gap: 6 (ref 5–15)
BUN: 10 mg/dL (ref 6–20)
CO2: 24 mmol/L (ref 22–32)
Calcium: 9.3 mg/dL (ref 8.9–10.3)
Chloride: 108 mmol/L (ref 98–111)
Creatinine, Ser: 0.64 mg/dL (ref 0.44–1.00)
GFR calc Af Amer: >60 mL/min (ref >60)
GFR calc non Af Amer: >60 mL/min (ref >60)
Glucose, Bld: 86 mg/dL (ref 70–99)
Potassium: 3.4 mmol/L — ABNORMAL LOW (ref 3.5–5.1)
Sodium: 138 mmol/L (ref 135–145)

## 2019-07-25 MED ORDER — CEPHALEXIN 500 MG PO CAPS: 500.0000 mg | ORAL_CAPSULE | Freq: Three times a day (TID) | ORAL | 0 refills | Status: DC

## 2019-07-25 MED ORDER — CEPHALEXIN 500 MG PO CAPS
500.0000 mg | ORAL_CAPSULE | Freq: Once | ORAL | Status: AC
  Administered 2019-07-25: 07:00:00 500 mg via ORAL
  Filled 2019-07-25: qty 1

## 2019-07-25 NOTE — ED Notes (Signed)
Patient tolerated po meds well. Will continue to monitor.

## 2019-07-25 NOTE — Discharge Instructions (Signed)
Take antibiotic as prescribed (Keflex 500mg three times daily x 7 days). Return to the ER for worsening symptoms, persistent vomiting, difficulty breathing or other concerns. 

## 2019-07-25 NOTE — ED Notes (Signed)
FHT difficult to assess through excess abdominal adipose. MD notified and will attempt [place]. Blood  and urine sent to lab.

## 2019-07-25 NOTE — ED Triage Notes (Addendum)
Patient reports she suddenly started shaking and since she is pregnant concerned and wanted to be checked out.  No obvious shaking or chills noted during triage.

## 2019-07-25 NOTE — ED Notes (Signed)
Patient is [redacted] weeks pregnant and presents to ED because she has the chills. Patient denies other symptoms of having a cold, flu, covid or intestinal virus. Patient just states she has had the chills. Patient's first pregnancy.

## 2019-07-25 NOTE — ED Provider Notes (Signed)
[facility] Emergency Department Provider Note   ____________________________________________   First MD Initiated Contact with Patient 07/25/19 [phone]     (approximate)  I have reviewed the triage vital signs and the nursing notes.   HISTORY  Chief Complaint Chills    HPI Pamela Benjamin is a 28 y.o. female G1P0 approximately [redacted] weeks pregnant by US who presents to the ED from home for "shivering". Patient was awake at 2am when she felt cold and shivered. Denies fever, cough, chest pain, shortness of breath, abdominal pain, nausea, vomiting, dysuria, diarrhea. Denies recent travel, trauma or exposure to persons diagnosed with Coronavirus.       Past Medical History:  Diagnosis Date  . ADHD (attention deficit hyperactivity disorder)   . Asthma   . Hypertension    Propanolol 2 pills bid, pt unaware of dose  . Mood disorder (HCC)   . Sleep apnea     Patient Active Problem List   Diagnosis Date Noted  . Obesity affecting pregnancy 06/25/2019  . Menorrhagia with irregular cycle 08/09/2018  . Mosquito bite 08/09/2018  . Nausea without vomiting 08/09/2018  . Needs flu shot 08/09/2018  . Hypertension 12/23/2017  . Chronic tension-type headache, not intractable 07/01/2016  . OSA on CPAP 02/02/2015  . Whole body pain 02/02/2015    No past surgical history on file.  Prior to Admission medications   Medication Sig Start Date End Date Taking? Authorizing Provider  albuterol (PROVENTIL HFA;VENTOLIN HFA) 108 (90 Base) MCG/ACT inhaler Inhale 2 puffs into the lungs.    [provider]  cephALEXin (KEFLEX) 500 MG capsule Take 1 capsule (500 mg total) by mouth 3 (three) times daily. 07/25/19   Irean HongSung, Talina Pleitez J, MD  EPINEPHrine 0.3 mg/0.3 mL IJ SOAJ injection Inject into the muscle.    [provider]    Allergies Other, Pollen extract, and Latex  Family History  Adopted: Yes  Problem Relation Age of Onset  . Breast cancer Neg Hx   .  Ovarian cancer Neg Hx   . Colon cancer Neg Hx     Social History Social History   Tobacco Use  . Smoking status: Former Smoker    Types: Cigarettes    Start date: 11/25/2006    Quit date: 02/24/2019    Years since quitting: 0.4  . Smokeless tobacco: Never Used  Substance Use Topics  . Alcohol use: No  . Drug use: No    Review of Systems  Constitutional: No fever/chills Eyes: No visual changes. ENT: No sore throat. Cardiovascular: Denies chest pain. Respiratory: Denies shortness of breath. Gastrointestinal: No abdominal pain.  No nausea, no vomiting.  No diarrhea.  No constipation. Genitourinary: Negative for dysuria. Musculoskeletal: Negative for back pain. Skin: Negative for rash. Neurological: Negative for headaches, focal weakness or numbness.   ____________________________________________   PHYSICAL EXAM:  VITAL SIGNS: ED Triage Vitals  Enc Vitals Group     BP 07/25/19 0409 136/82     Pulse Rate 07/25/19 0409 91     Resp 07/25/19 0409 16     Temp 07/25/19 0409 98.2 F (36.8 C)     Temp Source 07/25/19 0409 Oral     SpO2 07/25/19 0409 98 %     Weight 07/25/19 0407 (!) 318 lb (144.2 kg)     Height 07/25/19 0407 5\' 4"  (1.626 m)     Head Circumference --      Peak Flow --      Pain Score 07/25/19 0406  0     Pain Loc --      Pain Edu? --      Excl. in [place]? --     Constitutional: Alert and oriented. Well appearing and in no acute distress. Eyes: Conjunctivae are normal. PERRL. EOMI. Head: Atraumatic. Nose: No congestion/rhinnorhea. Mouth/Throat: Mucous membranes are moist.  Oropharynx non-erythematous. Neck: No stridor.  Supple neck without meningismus. Cardiovascular: Normal rate, regular rhythm. Grossly normal heart sounds.  Good peripheral circulation. Respiratory: Normal respiratory effort.  No retractions. Lungs CTAB. Gastrointestinal: Soft and nontender to light or deep palpation. No distention. No abdominal bruits. No CVA tenderness. Musculoskeletal:  No lower extremity tenderness nor edema.  No joint effusions. Neurologic:  Normal speech and language. No gross focal neurologic deficits are appreciated. No gait instability. Skin:  Skin is warm, dry and intact. No rash noted. No petechiae. Psychiatric: Mood and affect are normal. Speech and behavior are normal.  ____________________________________________   LABS (all labs ordered are listed, but only abnormal results are displayed)  Labs Reviewed  BASIC METABOLIC PANEL - Abnormal; Notable for the following components:      Result Value   Potassium 3.4 (*)    All other components within normal limits  URINALYSIS, COMPLETE (UACMP) WITH MICROSCOPIC - Abnormal; Notable for the following components:   Color, Urine AMBER (*)    APPearance HAZY (*)    Leukocytes,Ua MODERATE (*)    Bacteria, UA RARE (*)    All other components within normal limits  URINE CULTURE  CBC WITH DIFFERENTIAL/PLATELET   ____________________________________________  EKG  None ____________________________________________  RADIOLOGY  ED MD interpretation:  None  Official radiology report(s): No results found.  ____________________________________________   PROCEDURES  Procedure(s) performed (including Critical Care):  Procedures   ____________________________________________   INITIAL IMPRESSION / ASSESSMENT AND PLAN / ED COURSE  As part of my medical decision making, I reviewed the following data within the [place] notes reviewed and incorporated, Labs reviewed, Old chart reviewed and Notes from prior ED visits     Pamela Benjamin was evaluated in Emergency Department on 07/25/2019 for the symptoms described in the history of present illness. She was evaluated in the context of the global COVID-19 pandemic, which necessitated consideration that the patient might be at risk for infection with the SARS-CoV-2 virus that causes COVID-19. Institutional protocols and  algorithms that pertain to the evaluation of patients at risk for COVID-19 are in a state of rapid change based on information released by regulatory bodies including the CDC and federal and state organizations. These policies and algorithms were followed during the patient's care in the ED.   27 year old female approximately [redacted] weeks pregnant by OB US who presents with shivering. Symptoms currently resolved with blanket provided in triage. Wanted evaluation since she is pregnant. Will obtain basic labs and UA.      ____________________________________________   FINAL CLINICAL IMPRESSION(S) / ED DIAGNOSES  Final diagnoses:  Chills  Urinary tract infection without hematuria, site unspecified  [redacted] weeks gestation of pregnancy     ED Discharge Orders         Ordered    cephALEXin (KEFLEX) 500 MG capsule  3 times daily     07/25/19 8101           Note:  This document was prepared using Dragon voice recognition software and may include unintentional dictation errors.   Paulette Blanch, MD 07/25/19 [phone]

## 2019-07-26 LAB — URINE CULTURE

## 2019-08-03 ENCOUNTER — Telehealth: Payer: Self-pay | Admitting: Licensed Clinical Social Worker

## 2019-08-03 NOTE — Telephone Encounter (Signed)
-----   Message from Shawnee Knapp, RN sent at 07/30/2019 11:04 AM EDT ----- Regarding: referral

## 2019-08-03 NOTE — Telephone Encounter (Signed)
Referred by Shawnee Knapp, RN OBCM. Appointment scheduled for 08/19/2019.

## 2019-08-19 ENCOUNTER — Ambulatory Visit: Payer: Medicaid Other | Admitting: Licensed Clinical Social Worker

## 2019-08-19 ENCOUNTER — Encounter: Payer: Self-pay | Admitting: Licensed Clinical Social Worker

## 2019-08-19 ENCOUNTER — Telehealth: Payer: Self-pay | Admitting: Licensed Clinical Social Worker

## 2019-08-19 ENCOUNTER — Other Ambulatory Visit: Payer: Self-pay

## 2019-08-19 DIAGNOSIS — F4321 Adjustment disorder with depressed mood: Secondary | ICD-10-CM

## 2019-08-19 NOTE — Telephone Encounter (Signed)
LCSW spoke with patient and provided her information on [employer], [employer], and [employer]. LCSW encouraged patient to contact her if she has any other questions.
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
## 2019-09-17 ENCOUNTER — Other Ambulatory Visit: Payer: Self-pay

## 2019-09-17 ENCOUNTER — Encounter
Admission: RE | Admit: 2019-09-17 | Discharge: 2019-09-17 | Disposition: A | Discharge status: Home / Self Care | Payer: Medicaid Other | Source: Ambulatory Visit | Attending: Anesthesiology | Admitting: Anesthesiology

## 2019-09-17 NOTE — Consult Note (Signed)
[facility] Anesthesia Consultation  Pamela Benjamin GEX:[id number] DOB: 1991-02-04 DOA: 09/17/2019 PCP: Lavera Guise, MD   Requesting physician: Dr. Leonides Schanz Date of consultation: 09/17/19 Reason for consultation: Obesity during pregnancy  CHIEF COMPLAINT:  Obesity during pregnancy  HISTORY OF PRESENT ILLNESS: Pamela Benjamin  is a 28 y.o. female with a known history of obesity in pregnancy. This is her first pregnancy. Denies any hx of cardiovascular disease. Mild intermittent asthma, uses inhaler ~1x/week. Denies hx of bleeding disorders. Pt is adopted so is unaware of family hx.   PAST MEDICAL HISTORY:   Past Medical History:  Diagnosis Date  . ADHD (attention deficit hyperactivity disorder)   . Asthma   . Hypertension    Propanolol 2 pills bid, pt unaware of dose  . Mood disorder ([place])   . Sleep apnea     PAST SURGICAL HISTORY: No past surgical history on file.  SOCIAL HISTORY:  Social History   Tobacco Use  . Smoking status: Former Smoker    Packs/day: 0.00    Types: Cigarettes    Start date: 11/25/2006  . Smokeless tobacco: Never Used  Substance Use Topics  . Alcohol use: No    FAMILY HISTORY:  Family History  Adopted: Yes  Problem Relation Age of Onset  . Breast cancer Neg Hx   . Ovarian cancer Neg Hx   . Colon cancer Neg Hx     DRUG ALLERGIES:  Allergies  Allergen Reactions  . Other Other (See Comments)    Maple tree, dust, and rag weed  . Pollen Extract Other (See Comments)  . Latex Rash    Breaks out    REVIEW OF SYSTEMS:   RESPIRATORY: No cough, shortness of breath, wheezing.  CARDIOVASCULAR: No chest pain, orthopnea, edema.  HEMATOLOGY: No anemia, easy bruising or bleeding SKIN: No rash or lesion. NEUROLOGIC: No tingling, numbness, weakness.  PSYCHIATRY: No anxiety or depression.   MEDICATIONS AT HOME:  Prior to Admission medications   Medication Sig Start Date End Date Taking? Authorizing Provider   albuterol (PROVENTIL HFA;VENTOLIN HFA) 108 (90 Base) MCG/ACT inhaler Inhale 2 puffs into the lungs.    [provider]  cephALEXin (KEFLEX) 500 MG capsule Take 1 capsule (500 mg total) by mouth 3 (three) times daily. 07/25/19   Paulette Blanch, MD  EPINEPHrine 0.3 mg/0.3 mL IJ SOAJ injection Inject into the muscle.    [provider]      PHYSICAL EXAMINATION:   VITAL SIGNS: Last menstrual period 03/25/2019.  GENERAL:  28 y.o.-year-old patient no acute distress.  HEENT: Head atraumatic, normocephalic. Oropharynx and nasopharynx clear. MP 2, TM distance >3 cm, normal mouth opening, grade 1 upper lip bite LUNGS: Normal breath sounds bilaterally, no wheezing, rales,rhonchi. No use of accessory muscles of respiration.  CARDIOVASCULAR: S1, S2 normal. No murmurs, rubs, or gallops.  EXTREMITIES: No pedal edema, cyanosis, or clubbing.  NEUROLOGIC: normal gait PSYCHIATRIC: The patient is alert and oriented x 3.  SKIN: No obvious rash, lesion, or ulcer.    IMPRESSION AND PLAN:   Pamela Benjamin  is a 28 y.o. female presenting with obesity during pregnancy. BMI is currently 53 at [redacted] weeks gestation.   Airway exam reassuring today. Spinal processes and midline palpable. Weight distribution is mainly anterior.   We discussed analgesic options during labor including epidural analgesia. Discussed that in obesity there can be increased difficulty with epidural placement or even failure of successful epidural. We also discussed that even after successful epidural placement  there is increased risk of catheter migration out of the epidural space that would require catheter replacement. Discussed use of epidural vs spinal vs GA if cesarean delivery is required. Discussed increased risk of difficult intubation during pregnancy should an emergency cesarean delivery be required.   We discussed repeat evaluation at 34-36 weeks by anesthesia to determine whether there is a high risk of  complications of anesthesia for which we would recommend transfer of OB care to a facility with a higher maternal level of care designation.

## 2019-10-10 ENCOUNTER — Other Ambulatory Visit: Payer: Self-pay

## 2019-10-10 ENCOUNTER — Emergency Department
Admission: EM | Admit: 2019-10-10 | Discharge: 2019-10-10 | Disposition: A | Discharge status: Home / Self Care | Payer: Medicaid Other | Attending: Emergency Medicine | Admitting: Emergency Medicine

## 2019-10-10 ENCOUNTER — Encounter: Payer: Self-pay | Admitting: Emergency Medicine

## 2019-10-10 DIAGNOSIS — Z20828 Contact with and (suspected) exposure to other viral communicable diseases: Secondary | ICD-10-CM | POA: Insufficient documentation

## 2019-10-10 DIAGNOSIS — Z5321 Procedure and treatment not carried out due to patient leaving prior to being seen by health care provider: Secondary | ICD-10-CM | POA: Insufficient documentation

## 2019-10-10 NOTE — ED Triage Notes (Addendum)
Pt arrived via POV with reports of wanting COVID testing. Pt states she was at a wrestling event on 11/7 and was told this morning that someone at the event was positive for COVID.  Pt reports loss of taste and can't hear out of right ear that started Friday.

## 2019-10-11 ENCOUNTER — Other Ambulatory Visit: Payer: Self-pay

## 2019-10-11 DIAGNOSIS — Z20822 Contact with and (suspected) exposure to covid-19: Secondary | ICD-10-CM

## 2019-10-13 LAB — NOVEL CORONAVIRUS, NAA: SARS-CoV-2, NAA: NOT DETECTED

## 2019-11-01 ENCOUNTER — Other Ambulatory Visit: Payer: Self-pay | Admitting: Certified Nurse Midwife

## 2019-11-01 DIAGNOSIS — Z3689 Encounter for other specified antenatal screening: Secondary | ICD-10-CM

## 2019-11-04 ENCOUNTER — Other Ambulatory Visit: Payer: Self-pay

## 2019-11-04 DIAGNOSIS — Z3A30 30 weeks gestation of pregnancy: Secondary | ICD-10-CM

## 2019-11-08 ENCOUNTER — Other Ambulatory Visit: Payer: Self-pay | Admitting: Certified Nurse Midwife

## 2019-11-08 ENCOUNTER — Other Ambulatory Visit: Payer: Self-pay

## 2019-11-08 ENCOUNTER — Ambulatory Visit
Admission: RE | Admit: 2019-11-08 | Discharge: 2019-11-08 | Disposition: A | Discharge status: Home / Self Care | Payer: Medicaid Other | Source: Ambulatory Visit | Attending: Maternal & Fetal Medicine | Admitting: Maternal & Fetal Medicine

## 2019-11-08 ENCOUNTER — Ambulatory Visit: Payer: Medicaid Other

## 2019-11-08 DIAGNOSIS — N856 Intrauterine synechiae: Secondary | ICD-10-CM | POA: Insufficient documentation

## 2019-11-08 DIAGNOSIS — Z3689 Encounter for other specified antenatal screening: Secondary | ICD-10-CM | POA: Diagnosis not present

## 2019-11-08 DIAGNOSIS — Z3A30 30 weeks gestation of pregnancy: Secondary | ICD-10-CM | POA: Diagnosis not present

## 2019-11-08 DIAGNOSIS — O34593 Maternal care for other abnormalities of gravid uterus, third trimester: Secondary | ICD-10-CM | POA: Diagnosis not present

## 2019-11-08 DIAGNOSIS — O99211 Obesity complicating pregnancy, first trimester: Secondary | ICD-10-CM

## 2019-11-08 NOTE — Progress Notes (Signed)
12/14 AM BP not accurate due to the fact that RN was unable to obtain BP from Upper Arm because of large upper arm circumference.   Rechecked @1248  Left Lower Arm 136/86, Pulse 89 and Right Lower Arm 145/87, Pulse 84.

## 2019-11-11 ENCOUNTER — Other Ambulatory Visit: Payer: Self-pay | Admitting: Obstetrics and Gynecology

## 2019-11-11 ENCOUNTER — Other Ambulatory Visit: Payer: Self-pay

## 2019-11-11 DIAGNOSIS — O133 Gestational [pregnancy-induced] hypertension without significant proteinuria, third trimester: Secondary | ICD-10-CM

## 2019-11-11 DIAGNOSIS — Z3A30 30 weeks gestation of pregnancy: Secondary | ICD-10-CM

## 2019-11-12 ENCOUNTER — Other Ambulatory Visit: Payer: Self-pay

## 2019-11-12 ENCOUNTER — Encounter: Payer: Self-pay | Admitting: Obstetrics & Gynecology

## 2019-11-12 ENCOUNTER — Observation Stay
Admission: EM | Admit: 2019-11-12 | Discharge: 2019-11-12 | Disposition: A | Discharge status: Home / Self Care | Payer: Medicaid Other | Source: Home / Self Care | Admitting: Obstetrics & Gynecology

## 2019-11-12 ENCOUNTER — Observation Stay
Admission: EM | Admit: 2019-11-12 | Discharge: 2019-11-13 | Disposition: A | Discharge status: Home / Self Care | Payer: Medicaid Other | Source: Home / Self Care | Admitting: Obstetrics & Gynecology

## 2019-11-12 DIAGNOSIS — O99333 Smoking (tobacco) complicating pregnancy, third trimester: Secondary | ICD-10-CM | POA: Insufficient documentation

## 2019-11-12 DIAGNOSIS — F17210 Nicotine dependence, cigarettes, uncomplicated: Secondary | ICD-10-CM | POA: Insufficient documentation

## 2019-11-12 DIAGNOSIS — O133 Gestational [pregnancy-induced] hypertension without significant proteinuria, third trimester: Secondary | ICD-10-CM | POA: Diagnosis present

## 2019-11-12 DIAGNOSIS — Z87891 Personal history of nicotine dependence: Secondary | ICD-10-CM | POA: Insufficient documentation

## 2019-11-12 DIAGNOSIS — O26893 Other specified pregnancy related conditions, third trimester: Secondary | ICD-10-CM | POA: Insufficient documentation

## 2019-11-12 DIAGNOSIS — O99213 Obesity complicating pregnancy, third trimester: Secondary | ICD-10-CM | POA: Insufficient documentation

## 2019-11-12 DIAGNOSIS — Z79899 Other long term (current) drug therapy: Secondary | ICD-10-CM | POA: Insufficient documentation

## 2019-11-12 DIAGNOSIS — J45909 Unspecified asthma, uncomplicated: Secondary | ICD-10-CM | POA: Insufficient documentation

## 2019-11-12 DIAGNOSIS — Z3689 Encounter for other specified antenatal screening: Secondary | ICD-10-CM | POA: Insufficient documentation

## 2019-11-12 DIAGNOSIS — G4730 Sleep apnea, unspecified: Secondary | ICD-10-CM | POA: Insufficient documentation

## 2019-11-12 DIAGNOSIS — O09893 Supervision of other high risk pregnancies, third trimester: Secondary | ICD-10-CM | POA: Insufficient documentation

## 2019-11-12 DIAGNOSIS — O99513 Diseases of the respiratory system complicating pregnancy, third trimester: Secondary | ICD-10-CM | POA: Insufficient documentation

## 2019-11-12 DIAGNOSIS — Z3A30 30 weeks gestation of pregnancy: Secondary | ICD-10-CM | POA: Insufficient documentation

## 2019-11-12 DIAGNOSIS — Z3490 Encounter for supervision of normal pregnancy, unspecified, unspecified trimester: Secondary | ICD-10-CM

## 2019-11-12 LAB — COMPREHENSIVE METABOLIC PANEL
ALT: 18 U/L (ref 0–44)
AST: 15 U/L (ref 15–41)
Albumin: 3.2 g/dL — ABNORMAL LOW (ref 3.5–5.0)
Alkaline Phosphatase: 88 U/L (ref 38–126)
Anion gap: 10 (ref 5–15)
BUN: 7 mg/dL (ref 6–20)
CO2: 18 mmol/L — ABNORMAL LOW (ref 22–32)
Calcium: 9.2 mg/dL (ref 8.9–10.3)
Chloride: 107 mmol/L (ref 98–111)
Creatinine, Ser: 0.5 mg/dL (ref 0.44–1.00)
GFR calc Af Amer: >60 mL/min (ref >60)
GFR calc non Af Amer: >60 mL/min (ref >60)
Glucose, Bld: 86 mg/dL (ref 70–99)
Potassium: 4 mmol/L (ref 3.5–5.1)
Sodium: 135 mmol/L (ref 135–145)
Total Bilirubin: 0.5 mg/dL (ref 0.3–1.2)
Total Protein: 7 g/dL (ref 6.5–8.1)

## 2019-11-12 LAB — CBC
HCT: 37.9 % (ref 36.0–46.0)
Hemoglobin: 13.8 g/dL (ref 12.0–15.0)
MCH: 31.8 pg (ref 26.0–34.0)
MCHC: 36.4 g/dL — ABNORMAL HIGH (ref 30.0–36.0)
MCV: 87.3 fL (ref 80.0–100.0)
Platelets: 305 10*3/uL (ref 150–400)
RBC: 4.34 MIL/uL (ref 3.87–5.11)
RDW: 13.1 % (ref 11.5–15.5)
WBC: 10.9 10*3/uL — ABNORMAL HIGH (ref 4.0–10.5)
nRBC: 0 % (ref 0.0–0.2)

## 2019-11-12 LAB — PROTEIN / CREATININE RATIO, URINE
Creatinine, Urine: 82 mg/dL
Protein Creatinine Ratio: 0.09 mg/mg{Cre} (ref 0.00–0.15)
Total Protein, Urine: 7 mg/dL

## 2019-11-12 NOTE — Discharge Summary (Signed)
Pamela Benjamin is a 28 y.o. female. She is at [redacted]w[redacted]d gestation. Patient's last menstrual period was 03/25/2019 (exact date). Estimated Date of Delivery: 01/17/20  Prenatal care site: [facility]   Current pregnancy complicated by:  1. Elevated early 1h GTT: 139  3h GTT scheduled: 78, 133, 106, 87 2. Morbid Obesity: BMI 54.5 at NOB  Anesthesia consult done, Mallampati score 2( [place] 09/17/2019) ; 35 wks for reassessment [ ]    Low dose ASA 81mg  daily, PNV with folic acid 1mg  daily  Hx Sleep apnea, not using CPAP- needs sleep study- scheduled 09/23/2019  Baseline CMP- wnl and P/C ratio-92  Early GTT ] and A1c: 4.8, 3 hour gtt scheduled-WNL  Growth scans starting at 28 weeks and q 4 wk . NST start at 36 weeks , [ ]  IOL  3. Gestational Hypertension  09/23/2019 [redacted]w[redacted]d: 158/94, repeat 132/80  10/08/2019 [redacted]w[redacted]d: 142/92, repeat 132/88  11/12/19: 30+4wks: 159/98, rpt 142/96--> to triage for NST and repeat labs.   10/29/19 CMP and P/C ratio added to 28w labs- creatinine: 0.6, normal LFTs, P/C 123  4. Amniotic band  Noted on [redacted]w[redacted]d ultrasound  Referral to MFM ordered 10/29/2019  Seen 12/14: no fetal parts persistently in proximity to this region 5. IUGR:  Measuring [redacted]w[redacted]d at [redacted]w[redacted]d (EFW 16%) after measuring [redacted]w[redacted]d at anatomy scan at [redacted]w[redacted]d  Seen at [facility] 11/08/2019, EFW 6%:   Weekly dopplers and AFI, growth q3w--scheduled at MFM  Chief complaint: sent from office for elevated BP  Location: n/a- no symptoms.  Onset/timing: about 23wks, elevated BPs at each visit, some elevated BP at home monitoring.  Duration: 2 mos.  Quality: n/a Severity: n/a Aggravating or alleviating conditions: denies HA, VD, RUQ pain.   Associated signs/symptoms: no swelling, no CP, SOB Context: recent dx IUGR by MFM. Has weekly f/u with BPP and Dopplers.   S: Resting comfortably. no CTX, no VB.no LOF,  Active fetal movement. Denies: HA, visual changes, SOB, or RUQ/epigastric  pain  Maternal Medical History:   Past Medical History:  Diagnosis Date  . ADHD (attention deficit hyperactivity disorder)   . Asthma   . Hypertension    Propanolol 2 pills bid, pt unaware of dose  . Mood disorder (HCC)   . Sleep apnea     History reviewed. No pertinent surgical history.  Allergies  Allergen Reactions  . Other Other (See Comments)    Maple tree, dust, and rag weed  . Pollen Extract Other (See Comments)  . Latex Rash    Breaks out    Prior to Admission medications   Medication Sig Start Date End Date Taking? Authorizing Provider  albuterol (PROVENTIL HFA;VENTOLIN HFA) 108 (90 Base) MCG/ACT inhaler Inhale 2 puffs into the lungs.    [provider]  cephALEXin (KEFLEX) 500 MG capsule Take 1 capsule (500 mg total) by mouth 3 (three) times daily. Patient not taking: Reported on 11/12/2019 07/25/19   [facility], MD  EPINEPHrine 0.3 mg/0.3 mL IJ SOAJ injection Inject into the muscle.    [provider]      Social History: She  reports that she has quit smoking. Her smoking use included cigarettes. She started smoking about 12 years ago. She smoked 0.00 packs per day. She has never used smokeless tobacco. She reports that she does not drink alcohol or use drugs.  Family History: family history is not on file. She was adopted.   Review of Systems: A full review of systems was performed and negative  except as noted in the HPI.     O:  BP (!) 127/98 (BP Location: Left Arm)   Pulse 85   Temp 98 F (36.7 C) (Oral)   Resp 14   LMP 03/25/2019 (Exact Date)  Results for orders placed or performed during the hospital encounter of 11/12/19 (from the past 48 hour(s))  Comprehensive metabolic panel   Collection Time: 11/12/19 12:54 PM  Result Value Ref Range   Sodium 135 135 - 145 mmol/L   Potassium 4.0 3.5 - 5.1 mmol/L   Chloride 107 98 - 111 mmol/L   CO2 18 (L) 22 - 32 mmol/L   Glucose, Bld 86 70 - 99 mg/dL   BUN 7 6 - 20 mg/dL    Creatinine, Ser 0.50 0.44 - 1.00 mg/dL   Calcium 9.2 8.9 - 10.3 mg/dL   Total Protein 7.0 6.5 - 8.1 g/dL   Albumin 3.2 (L) 3.5 - 5.0 g/dL   AST 15 15 - 41 U/L   ALT 18 0 - 44 U/L   Alkaline Phosphatase 88 38 - 126 U/L   Total Bilirubin 0.5 0.3 - 1.2 mg/dL   GFR calc non Af Amer >60 >60 mL/min   GFR calc Af Amer >60 >60 mL/min   Anion gap 10 5 - 15  CBC on admission   Collection Time: 11/12/19 12:54 PM  Result Value Ref Range   WBC 10.9 (H) 4.0 - 10.5 K/uL   RBC 4.34 3.87 - 5.11 MIL/uL   Hemoglobin 13.8 12.0 - 15.0 g/dL   HCT 37.9 36.0 - 46.0 %   MCV 87.3 80.0 - 100.0 fL   MCH 31.8 26.0 - 34.0 pg   MCHC 36.4 (H) 30.0 - 36.0 g/dL   RDW 13.1 11.5 - 15.5 %   Platelets 305 150 - 400 K/uL   nRBC 0.0 0.0 - 0.2 %  Protein / creatinine ratio, urine   Collection Time: 11/12/19  1:00 PM  Result Value Ref Range   Creatinine, Urine 82 mg/dL   Total Protein, Urine 7 mg/dL   Protein Creatinine Ratio 0.09 0.00 - 0.15 mg/mg[Cre]     Fetal  monitoring: Cat I Appropriate for GA, Reactive NST Baseline: 130bpm Variability: moderate Accelerations:  present x >2 Decelerations absent Time 77mins    A/P: 28 y.o. [redacted]w[redacted]d here for antenatal surveillance for GHTN   Principle Diagnosis:  High risk pregnancy in third trimester   CMP, CBC and P/C ratio WNL  Normal to mild range BPs, asymptomatic.   IUGR- has f/u scheduled with MFM qMonday for BPP and Dopplers.   Fetal Wellbeing: Reassuring Cat 1 tracing with reactive NST   D/c home stable, precautions reviewed, follow-up as scheduled.    Francetta Found, CNM 11/12/2019  1:50 PM

## 2019-11-12 NOTE — OB Triage Note (Addendum)
Pt is a G1P0 and is [redacted]w[redacted]d presenting to L&D with c/o elevated blood pressures. Pt was seen here earlier and was told to monitor BP at home. Pt reports her BP was 177/104 around 2230 tonight. Pt is not currently on any medication for BP. Pt confirms positive fetal movement and denies LOF or vaginal bleeding. Upon assessment, blood pressure is 126/74. Pt clonus is absent and reflexes are +2. Nonpitting edema noted on lower extremities. Will continue to monitor.

## 2019-11-12 NOTE — OB Triage Note (Signed)
Lab meet patient at OBS 3 and drew blood work, pt then voided to collect a urine sample. RN hooked patient up to FHR monitors.     Patient was sent over from MD office for Blood Pressure evaluation and possible IUGR.  Pt denies any vaginal bleeding or leaking fluid.

## 2019-11-12 NOTE — Discharge Summary (Signed)
RN reviewed discharge instructions with patient. Gave patient opportunity for questions. All questions answered at this time. Pt verbalized understanding. Pt discharged home. 

## 2019-11-13 ENCOUNTER — Encounter: Payer: Self-pay | Admitting: Obstetrics & Gynecology

## 2019-11-13 ENCOUNTER — Other Ambulatory Visit: Payer: Self-pay

## 2019-11-13 ENCOUNTER — Inpatient Hospital Stay: Payer: Medicaid Other

## 2019-11-13 ENCOUNTER — Encounter: Payer: Self-pay | Admitting: Obstetrics and Gynecology

## 2019-11-13 ENCOUNTER — Inpatient Hospital Stay
Admission: EM | Admit: 2019-11-13 | Discharge: 2019-11-15 | DRG: 833 | Disposition: A | Discharge status: Home / Self Care | Payer: Medicaid Other | Attending: Obstetrics and Gynecology | Admitting: Obstetrics and Gynecology

## 2019-11-13 DIAGNOSIS — Z20828 Contact with and (suspected) exposure to other viral communicable diseases: Secondary | ICD-10-CM | POA: Diagnosis present

## 2019-11-13 DIAGNOSIS — G4730 Sleep apnea, unspecified: Secondary | ICD-10-CM | POA: Diagnosis present

## 2019-11-13 DIAGNOSIS — J45909 Unspecified asthma, uncomplicated: Secondary | ICD-10-CM | POA: Diagnosis present

## 2019-11-13 DIAGNOSIS — O99213 Obesity complicating pregnancy, third trimester: Secondary | ICD-10-CM | POA: Diagnosis present

## 2019-11-13 DIAGNOSIS — O99513 Diseases of the respiratory system complicating pregnancy, third trimester: Secondary | ICD-10-CM | POA: Diagnosis present

## 2019-11-13 DIAGNOSIS — O418X30 Other specified disorders of amniotic fluid and membranes, third trimester, not applicable or unspecified: Secondary | ICD-10-CM | POA: Diagnosis present

## 2019-11-13 DIAGNOSIS — F17210 Nicotine dependence, cigarettes, uncomplicated: Secondary | ICD-10-CM | POA: Diagnosis present

## 2019-11-13 DIAGNOSIS — O365930 Maternal care for other known or suspected poor fetal growth, third trimester, not applicable or unspecified: Secondary | ICD-10-CM | POA: Diagnosis present

## 2019-11-13 DIAGNOSIS — R519 Headache, unspecified: Secondary | ICD-10-CM | POA: Diagnosis present

## 2019-11-13 DIAGNOSIS — O321XX0 Maternal care for breech presentation, not applicable or unspecified: Secondary | ICD-10-CM | POA: Diagnosis present

## 2019-11-13 DIAGNOSIS — Z3490 Encounter for supervision of normal pregnancy, unspecified, unspecified trimester: Secondary | ICD-10-CM

## 2019-11-13 DIAGNOSIS — O1493 Unspecified pre-eclampsia, third trimester: Secondary | ICD-10-CM | POA: Diagnosis not present

## 2019-11-13 DIAGNOSIS — Z7982 Long term (current) use of aspirin: Secondary | ICD-10-CM | POA: Diagnosis not present

## 2019-11-13 DIAGNOSIS — O133 Gestational [pregnancy-induced] hypertension without significant proteinuria, third trimester: Secondary | ICD-10-CM | POA: Diagnosis present

## 2019-11-13 DIAGNOSIS — O99333 Smoking (tobacco) complicating pregnancy, third trimester: Secondary | ICD-10-CM | POA: Diagnosis present

## 2019-11-13 DIAGNOSIS — Z6841 Body Mass Index (BMI) 40.0 and over, adult: Secondary | ICD-10-CM | POA: Diagnosis not present

## 2019-11-13 DIAGNOSIS — Z3A30 30 weeks gestation of pregnancy: Secondary | ICD-10-CM

## 2019-11-13 LAB — CBC
HCT: 39.2 % (ref 36.0–46.0)
Hemoglobin: 13.4 g/dL (ref 12.0–15.0)
MCH: 31.6 pg (ref 26.0–34.0)
MCHC: 34.2 g/dL (ref 30.0–36.0)
MCV: 92.5 fL (ref 80.0–100.0)
Platelets: 314 10*3/uL (ref 150–400)
RBC: 4.24 MIL/uL (ref 3.87–5.11)
RDW: 13.2 % (ref 11.5–15.5)
WBC: 13.5 10*3/uL — ABNORMAL HIGH (ref 4.0–10.5)
nRBC: 0 % (ref 0.0–0.2)

## 2019-11-13 LAB — COMPREHENSIVE METABOLIC PANEL
ALT: 17 U/L (ref 0–44)
AST: 15 U/L (ref 15–41)
Albumin: 3.2 g/dL — ABNORMAL LOW (ref 3.5–5.0)
Alkaline Phosphatase: 84 U/L (ref 38–126)
Anion gap: 9 (ref 5–15)
BUN: 9 mg/dL (ref 6–20)
CO2: 23 mmol/L (ref 22–32)
Calcium: 9.1 mg/dL (ref 8.9–10.3)
Chloride: 106 mmol/L (ref 98–111)
Creatinine, Ser: 0.54 mg/dL (ref 0.44–1.00)
GFR calc Af Amer: >60 mL/min (ref >60)
GFR calc non Af Amer: >60 mL/min (ref >60)
Glucose, Bld: 78 mg/dL (ref 70–99)
Potassium: 3.6 mmol/L (ref 3.5–5.1)
Sodium: 138 mmol/L (ref 135–145)
Total Bilirubin: 0.4 mg/dL (ref 0.3–1.2)
Total Protein: 6.9 g/dL (ref 6.5–8.1)

## 2019-11-13 LAB — PROTEIN / CREATININE RATIO, URINE
Creatinine, Urine: 268 mg/dL
Protein Creatinine Ratio: 0.06 mg/mg{Cre} (ref 0.00–0.15)
Total Protein, Urine: 16 mg/dL

## 2019-11-13 MED ORDER — BETAMETHASONE SOD PHOS & ACET 6 (3-3) MG/ML IJ SUSP
INTRAMUSCULAR | Status: AC
  Administered 2019-11-13: 21:00:00 12 mg via INTRAMUSCULAR
  Filled 2019-11-13: qty 5

## 2019-11-13 MED ORDER — LABETALOL HCL 5 MG/ML IV SOLN: 20.0000 mg | INTRAVENOUS | Status: DC | PRN

## 2019-11-13 MED ORDER — MAGNESIUM SULFATE BOLUS VIA INFUSION
4.0000 g | Freq: Once | INTRAVENOUS | Status: AC
  Administered 2019-11-13: 23:00:00 4 g via INTRAVENOUS
  Filled 2019-11-13: qty 1000

## 2019-11-13 MED ORDER — BETAMETHASONE SOD PHOS & ACET 6 (3-3) MG/ML IJ SUSP
12.0000 mg | INTRAMUSCULAR | Status: AC
  Administered 2019-11-14: 21:00:00 12 mg via INTRAMUSCULAR
  Filled 2019-11-13: qty 5

## 2019-11-13 MED ORDER — LABETALOL HCL 5 MG/ML IV SOLN: 80.0000 mg | INTRAVENOUS | Status: DC | PRN

## 2019-11-13 MED ORDER — MAGNESIUM SULFATE 40 GM/1000ML IV SOLN
2.0000 g/h | INTRAVENOUS | Status: DC
  Administered 2019-11-14: 17:00:00 2 g/h via INTRAVENOUS
  Filled 2019-11-13 (×2): qty 1000

## 2019-11-13 MED ORDER — ACETAMINOPHEN 325 MG PO TABS
650.0000 mg | ORAL_TABLET | ORAL | Status: DC | PRN
  Administered 2019-11-14 (×2): 650 mg via ORAL
  Filled 2019-11-13 (×3): qty 2

## 2019-11-13 MED ORDER — HYDRALAZINE HCL 20 MG/ML IJ SOLN: 10.0000 mg | INTRAMUSCULAR | Status: DC | PRN

## 2019-11-13 MED ORDER — LACTATED RINGERS IV SOLN: INTRAVENOUS | Status: DC

## 2019-11-13 MED ORDER — LABETALOL HCL 5 MG/ML IV SOLN: 40.0000 mg | INTRAVENOUS | Status: DC | PRN

## 2019-11-13 NOTE — OB Triage Note (Signed)
Patient came in for observation for preeclampsia workup for elevated blood pressure. Patient states she has a thobbing anterior headache that she rates 8/10. Patient denies any other complaints. Patient denies uterine contractions upon arrival. Patient reports +FM. Patient denies leaking of fluid and denies vaginal bleeding and spotting. Vital signs stable and patient afebrile. FHR baseline 130 with moderate variability with accelerations 15 x 15 and no decelerations. Patient in room by herself. Will continue to monitor.

## 2019-11-13 NOTE — Progress Notes (Signed)
S:  Pt is a 28 yo F with super morbid obesity (BMI 53.7) and HTN presenting with pre-eclampsia and severe range blood pressures.  O/A:   Today's Vitals   11/13/19 1933 11/13/19 2012 11/13/19 2015  BP: (!) 152/119 140/74 121/88  Pulse: (!) 103 89 88  Resp: 18 19   Temp:  36.7 C   TempSrc:  Oral   SpO2: 99%    Weight:  (!) 142 kg   Height:  5\' 4"  (1.626 m)   PainSc:  8     Body mass index is 53.73 kg/m.   General: 28 yo F in no acute distress HEENT:  Head atraumatic, normocephalic, MP score 2, TM distance >3cm, good mouth opening, grade 1 upper lip bite Lungs: breathing comfortably, no increased WOB CV: S1, S2 normal, no murmurs, rubs, or gallops Psychiatric: Patient is A&Ox3  P:  Pt is a 28 yo super morbidly obese female presenting for induction of labor due to pre-eclampsia with severe range blood pressures.  Anesthesia asked to see the patient to determine if it would be safe for her to deliver at our institution.  Based on her good airway exam I think it is safe for her to stay for induction and delivery.  I did reinforce the risks of neuraxial anesthesia in morbidly obese patients including difficulty in placement and increased risk of epidural failure.  I also went over the risk of general anesthesia if she were to go for emergent c-section.  She knew of these risks as they had previously been explained to her at her previous consultation and understood.  Thank you for this interesting consult.  Call with further questions/needs.  Gerlene Fee, MD Dept. of Anesthesiology at [facility]

## 2019-11-13 NOTE — H&P (Signed)
OB History & Physical   History of Present Illness:  Chief Complaint: anterior headache and elevated BP  HPI:  Pamela Benjamin is a 28 y.o. G1P0000 female at 3831w5d dated by US at 7124w4d.  She presents to L&D for eval of headache and elevated BP. Known GHTN, eval with neg labs yesterday. Reports active FM, denies UCs, LOF or VB.   Reports severe anterior HA, present since last night, 8/10 pain. Took tylenol x 1 early this am which did not improve HA.   Pregnancy Issues: 1. Elevated early 1h GTT: 139  3h GTT scheduled: 78, 133, 106, 87 2. Morbid Obesity: BMI 54.5 at NOB  Anesthesia consult done, Mallampati score 2( [place] 09/17/2019) ; 35 wks for reassessment [ ]    Low dose ASA 81mg  daily, PNV with folic acid 1mg  daily  Hx Sleep apnea, not using CPAP- needs sleep study- scheduled 09/23/2019  Baseline CMP- wnl and P/C ratio-92  Early GTT Shakeel.Aas[139 ] and A1c: 4.8, 3 hour gtt scheduled-WNL  Growth scans starting at 28 weeks and q 4 wk . NST start at 36 weeks , [ ]  IOL  3. Gestational Hypertension  09/23/2019 9066w3d: 158/94, repeat 132/80  10/08/2019 313w4d: 142/92, repeat 132/88  11/12/19: 30+4wks: 159/98, rpt 142/96--> to triage for NST and repeat labs.   10/29/19 CMP and P/C ratio added to 28w labs- creatinine: 0.6, normal LFTs, P/C 123  4. Amniotic band  Noted on 6339w4d ultrasound  Referral to MFM ordered 10/29/2019  Seen 12/14: no fetal parts persistently in proximity to this region 5. IUGR:  Measuring 5555w2d at 7739w4d (EFW 16%) after measuring 5587w2d at anatomy scan at 3260w4d  [facility] EFW 6%:   Weekly dopplers and AFI, growth q3w--scheduled at MFM   Maternal Medical History:   Past Medical History:  Diagnosis Date  . ADHD (attention deficit hyperactivity disorder)   . Asthma   . Hypertension    Propanolol 2 pills bid, pt unaware of dose  . Mood disorder ([facility])   . Sleep apnea   Pt denies chronic HTN or previous medication for same.    History  reviewed. No pertinent surgical history.  Allergies  Allergen Reactions  . Other Other (See Comments)    Maple tree, dust, and rag weed  . Pollen Extract Other (See Comments)  . Latex Rash    Breaks out    Prior to Admission medications   Medication Sig Start Date End Date Taking? Authorizing Provider  albuterol (PROVENTIL HFA;VENTOLIN HFA) 108 (90 Base) MCG/ACT inhaler Inhale 2 puffs into the lungs.   Yes [provider]  Prenatal Vit-Fe Fumarate-FA (PRENATAL MULTIVITAMIN) TABS tablet Take 1 tablet by mouth daily at 12 noon.   Yes [provider]  cephALEXin (KEFLEX) 500 MG capsule Take 1 capsule (500 mg total) by mouth 3 (three) times daily. Patient not taking: Reported on 11/12/2019 07/25/19   Irean HongSung, Jade J, MD  EPINEPHrine 0.3 mg/0.3 mL IJ SOAJ injection Inject into the muscle.    [provider]     [facility]  Social History: She  reports that she has been smoking cigarettes. She started smoking about 12 years ago. She has a 12.00 pack-year smoking history. She has never used smokeless tobacco. She reports that she does not drink alcohol or use drugs.  Family History: family history is not on file. She was adopted.   Review of Systems: A full review of systems was performed and negative except as  noted in the HPI.     Physical Exam:  Vital Signs: BP 121/88 (BP Location: Left Arm)   Pulse 88   Temp 98.1 F (36.7 C) (Oral)   Resp 19   Ht  (1.626 m)   Wt (!) 142 kg   LMP 03/25/2019 (Exact Date)   SpO2 99%   BMI 53.73 kg/m  General: no acute distress.  HEENT: normocephalic, atraumatic Heart: regular rate & rhythm.  No murmurs/rubs/gallops Lungs: clear to auscultation bilaterally, normal respiratory effort Abdomen: soft, gravid, non-tender;  EFW: 1218grams, EFW 6%, last growth US done 12/14 Pelvic: deferred  Extremities: non-tender, symmetric, 1+ LE edema bilaterally.  DTRs: 2+  Neurologic: Alert & oriented  x 3.    Results for orders placed or performed during the hospital encounter of 11/13/19 (from the past 24 hour(s))  Protein / creatinine ratio, urine     Status: None   Collection Time: 11/13/19  8:57 PM  Result Value Ref Range   Creatinine, Urine 268 mg/dL   Total Protein, Urine 16 mg/dL   Protein Creatinine Ratio 0.06 0.00 - 0.15 mg/mg[Cre]  Comprehensive metabolic panel     Status: Abnormal   Collection Time: 11/13/19  9:23 PM  Result Value Ref Range   Sodium 138 135 - 145 mmol/L   Potassium 3.6 3.5 - 5.1 mmol/L   Chloride 106 98 - 111 mmol/L   CO2 23 22 - 32 mmol/L   Glucose, Bld 78 70 - 99 mg/dL   BUN 9 6 - 20 mg/dL   Creatinine, Ser 1.61 0.44 - 1.00 mg/dL   Calcium 9.1 8.9 - 09.6 mg/dL   Total Protein 6.9 6.5 - 8.1 g/dL   Albumin 3.2 (L) 3.5 - 5.0 g/dL   AST 15 15 - 41 U/L   ALT 17 0 - 44 U/L   Alkaline Phosphatase 84 38 - 126 U/L   Total Bilirubin 0.4 0.3 - 1.2 mg/dL   GFR calc non Af Amer >60 >60 mL/min   GFR calc Af Amer >60 >60 mL/min   Anion gap 9 5 - 15  CBC     Status: Abnormal   Collection Time: 11/13/19  9:23 PM  Result Value Ref Range   WBC 13.5 (H) 4.0 - 10.5 K/uL   RBC 4.24 3.87 - 5.11 MIL/uL   Hemoglobin 13.4 12.0 - 15.0 g/dL   HCT 04.5 40.9 - 81.1 %   MCV 92.5 80.0 - 100.0 fL   MCH 31.6 26.0 - 34.0 pg   MCHC 34.2 30.0 - 36.0 g/dL   RDW 91.4 78.2 - 95.6 %   Platelets 314 150 - 400 K/uL   nRBC 0.0 0.0 - 0.2 %    Pertinent Results:  Prenatal Labs: Blood type/Rh  O Pos  Antibody screen neg  Rubella Immune  Varicella Immune  RPR NR  HBsAg Neg  HIV NR  GC neg  Chlamydia neg  Genetic screening  negative MaterniT21, neg AFP  1 hour GTT  139 early, 130 at 28wks  3 hour GTT  early: 78, 133, 106, 87  GBS Not done   FHT: 135bpm, moderate variability, + accels, no decels TOCO: none SVE: deferred   US OB Limited  Result Date: 11/13/2019 CLINICAL DATA:  Gestational hypertension in 3rd trimester pregnancy. IUGR. EXAM: LIMITED OBSTETRIC  ULTRASOUND FINDINGS: Number of Fetuses: 1 Heart Rate:  135 bpm Movement: Yes Presentation: Breech Placental Location: Posterior Previa: No Amniotic Fluid (Subjective):  Within normal limits. AFI: 13.3 cm BPD: 7.0  cm 28 w  0 d MATERNAL FINDINGS: Cervix: Appears closed, and measures approximately 3.3 cm in length. IMPRESSION: Single living intrauterine fetus in breech presentation. Amniotic fluid volume within normal limits, with AFI of 13.3 cm. This exam is performed on an emergent basis and does not comprehensively evaluate fetal size, dating, or anatomy; follow-up complete OB US should be considered if further fetal assessment is warranted. Electronically Signed   By: Danae Orleans M.D.   On: 11/13/2019 22:04   [place] MFM OB DETAIL +14 WK  Result Date: 11/08/2019 ----------------------------------------------------------------------  OBSTETRICS REPORT                       (Signed Final 11/08/2019 12:13 pm) ---------------------------------------------------------------------- PATIENT INFO:  ID #:       [id number]                          D.O.B.:  Jan 03, 1991 (28 yrs)  Name:       KELE WITHEM [facility]                Visit Date: 11/08/2019 09:04 am ---------------------------------------------------------------------- PERFORMED BY:  Performed By:     Bolivar Haw          Secondary Phy.:   London Sheer                                                             MD  Referred By:      Claris Che               Address:          [address] [place],                                                             [place] [address] ---------------------------------------------------------------------- SERVICE(S) PROVIDED:   [place] MFM OB COMP + 14 WK                               76805.01   ---------------------------------------------------------------------- INDICATIONS:   [redacted] weeks gestation  of pregnancy                Z3A.30   Amniotic band  ---------------------------------------------------------------------- FETAL EVALUATION:  Num Of Fetuses:         1  Fetal Heart Rate(bpm):  135  Cardiac Activity:       Present  Presentation:           Breech  Placenta:               Posterior, fundal  P. Cord Insertion:      Normal  AFI Sum(cm)     %Tile  11.9            29  Comment:    Band seen ruq ---------------------------------------------------------------------- BIOMETRY:  BPD:      67.4  mm     G. Age:  27w 1d        < 1  %    CI:        73.34   %    70 - 86                                                          FL/HC:      21.6   %    19.2 - 21.4  HC:      250.1  mm     G. Age:  27w 1d        < 3  %    HC/AC:      1.02        0.99 - 1.21  AC:      244.3  mm     G. Age:  28w 5d         12  %    FL/BPD:     80.0   %    71 - 87  FL:       53.9  mm     G. Age:  28w 4d          7  %    FL/AC:      22.1   %    20 - 24  HUM:      49.2  mm     G. Age:  29w 0d         30  %  CER:      31.4  mm     G. Age:  27w 3d          9  %  CM:        7.7  mm  Est. FW:    1218  gm    2 lb 11 oz       6  % ---------------------------------------------------------------------- GESTATIONAL AGE:  LMP:           32w 4d        Date:  03/25/19                 EDD:   12/30/19  U/S Today:     27w 6d                                        EDD:   02/01/20  Best:  30w 0d     Det. By:  Marcella Dubs         EDD:   01/17/20                                      (07/23/19) ---------------------------------------------------------------------- ANATOMY:  Cranium:               Within Normal Limits   Aortic Arch:            Normal appearance  Cavum:                 CSP visualized         Ductal Arch:            Not visualized  Ventricles:            Normal appearance      Diaphragm:              Within Normal Limits  Choroid  Plexus:        Within Normal Limits   Stomach:                Seen  Cerebellum:            Within Normal Limits   Abdomen:                Within Normal                                                                        Limits  Posterior Fossa:       Within Normal Limits   Abdominal Wall:         Normal appearance  Nuchal Fold:           Beyond 22 weeks        Cord Vessels:           3 vessels                         gestation  Face:                  Orbits visualized      Kidneys:                Normal appearance  Lips:                  Normal appearance      Bladder:                Seen  Thoracic:              Within Normal Limits   Spine:                  Limited Views due  To Fetal Position  Heart:                 4-Chamber view         Upper Extremities:      Visualized                         appears normal  RVOT:                  Normal appearance      Lower Extremities:      Visualized  LVOT:                  Normal appearance ---------------------------------------------------------------------- IMPRESSION:  Dear Colleagues  Thank you for referring your patient for detailed anatomic  survey due to synechia.  History is also significant for  elevated maternal BMI.  Ultrasound demonstrates a singleton gestation at 30 weeks 0  days with subjectively normal amniotic fluid volume.  The estimated fetal weight is 1218g (6%). Due to fetal  position and advanced gestational age, images of the sacral  spine, CSP, ductal arch, and open hands were suboptimally  seen.  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   No fetal parts are persistently in proximity to this  region throughout the duration of this scan.  The remainder of the detailed anatomic survey was  unremarkable.  The amniotic fluid volume was normal and  active fetal movements were seen.  Findings were reviewed. 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  The possible etiologies of IUGR were discussed at length  with the patient.  The majority of IUGR fetuses diagnosed  towards the end of the third trimester will either be  constitutionally small, or have growth restriction due to  placental insufficiency.  Other causes are aneuploidy, other  genetic disorders, maternal conditions such as hypertension,  medications/exposures like tobacco,  fetal malformations,  and fetal infection.    Doppler velocimetry is a valuable tool in  assessing small fetuses, and has been associated with a  decreased mortality in small fetuses.   Normal umbilical artery  doppler findings and BPP are very reassuring.  No difference in obstetrical outcome exists in pregnancies  where the presence of uterine synechia is noted.  Recommend weekly antenatal tesitng with dopplers and fluid  assessment. We will reassess fetal anatomy suboptimally  imaged at that time.   Follow up growth in 3 weeks.  Appointments have been scheduled at [facility].  Thank you for allowing [place] to participate in your patient's care.  Please do not hesitate to contact us if we can be of further  assistance. ----------------------------------------------------------------------                   Consuelo Pandy, MD Electronically Signed Final Report   11/08/2019 12:13 pm ----------------------------------------------------------------------   Assessment:  Pamela Benjamin is a 28 y.o. G1P0000 female at [redacted]w[redacted]d with GHTN with severe features, IUGR, morbid obesity.   Plan:  1. Admit to Labor & Delivery; consents reviewed and obtained - POC discussed with Dr Dalbert Garnet, [facility] now with severe features by HA and severe range BP, known IUGR.  - COVID swab pending.  - Magnesium bolus 4gm bolus then 2gm per hr. - Betamethasone  1st dose tonight.  - strict I/Os, may use bedside commode. If urine decreases, then needs foley cath.  - start 24hr urine collection for protein.  -  seen by anesthesia  for airway exam.  - OB US Limited-  AFI WNL, remains breech presentation; Has f/u dopplers and BPP with MFM on Monday 12/21.  - Neonatology consult   2. Fetal Well being  - Fetal Tracing: Cat I tracing - Group B Streptococcus ppx indicated: n/a - Presentation: breech confirmed by [place] -  Plan for continuous fetal monitoring   3. Routine OB: - Prenatal labs reviewed, as above - Rh O Pos - CBC, T&S, RPR on admit - Clear fluids, IVF   Randa Ngo, CNM 11/13/19 10:07 PM

## 2019-11-13 NOTE — Discharge Summary (Signed)
Pamela Benjamin is a 28 y.o. female. She is at [redacted]w[redacted]d gestation. Patient's last menstrual period was 03/25/2019 (exact date). Estimated Date of Delivery: 01/17/20  Prenatal care site: [facility]   Preg C/B: 1. Elevated early 1h GTT: 139  3h GTT scheduled: 78, 133, 106, 87 2. Morbid Obesity: BMI 54.5 at NOB  Anesthesia consult done, Mallampati score 2( [place] 09/17/2019) ; 35 wks for reassessment [ ]    Low dose ASA 81mg  daily, PNV with folic acid 1mg  daily  Hx Sleep apnea, not using CPAP- needs sleep study- scheduled 09/23/2019  Baseline CMP- wnl and P/C ratio-92  Early GTT Almond.Hoe ] and A1c: 4.8, 3 hour gtt scheduled-WNL  Growth scans starting at 28 weeks and q 4 wk . NST start at 36 weeks , [ ]  IOL  3. Gestational Hypertension  09/23/2019 [redacted]w[redacted]d: 158/94, repeat 132/80  10/08/2019 [redacted]w[redacted]d: 142/92, repeat 132/88  11/12/19: 30+4wks: 159/98, rpt 142/96--> to triage for NST and repeat labs.   10/29/19 CMP and P/C ratio added to 28w labs- creatinine: 0.6, normal LFTs, P/C 123  4. Amniotic band  Noted on [redacted]w[redacted]d ultrasound  Referral to MFM ordered 10/29/2019  Seen 12/14: no fetal parts persistently in proximity to this region 5. IUGR:  Measuring [redacted]w[redacted]d at [redacted]w[redacted]d (EFW 16%) after measuring [redacted]w[redacted]d at anatomy scan at [redacted]w[redacted]d  Seen at [facility] 11/08/2019, EFW 6%:   Weekly dopplers and AFI, growth q3w--scheduled at [place]  Chief complaint: elevated BP at home around 10pm 177/104; seen earlier for labs and NST due to elevated BP in office today.   Location: no symptoms tonight Onset/timing: about 23wks, elevated BPs at each visit, some elevated BP at home monitoring.  Duration: 2 mos.  Quality: n/a Severity: n/a Aggravating or alleviating conditions: denies HA, VD, RUQ pain.   Associated signs/symptoms: no swelling, no CP, SOB Context: recent dx IUGR by MFM. Has weekly f/u with BPP and Dopplers.    Maternal Medical History:   Past Medical History:  Diagnosis Date  . ADHD  (attention deficit hyperactivity disorder)   . Asthma   . Hypertension    Propanolol 2 pills bid, pt unaware of dose  . Mood disorder ([place])   . Sleep apnea     History reviewed. No pertinent surgical history.  Allergies  Allergen Reactions  . Other Other (See Comments)    Maple tree, dust, and rag weed  . Pollen Extract Other (See Comments)  . Latex Rash    Breaks out    Prior to Admission medications   Medication Sig Start Date End Date Taking? Authorizing Provider  albuterol (PROVENTIL HFA;VENTOLIN HFA) 108 (90 Base) MCG/ACT inhaler Inhale 2 puffs into the lungs.   Yes [provider]  Prenatal Vit-Fe Fumarate-FA (PRENATAL MULTIVITAMIN) TABS tablet Take 1 tablet by mouth daily at 12 noon.   Yes [provider]  cephALEXin (KEFLEX) 500 MG capsule Take 1 capsule (500 mg total) by mouth 3 (three) times daily. Patient not taking: Reported on 11/12/2019 07/25/19   Paulette Blanch, MD  EPINEPHrine 0.3 mg/0.3 mL IJ SOAJ injection Inject into the muscle.    [provider]      Social History: She  reports that she has been smoking cigarettes. She started smoking about 12 years ago. She has a 12.00 pack-year smoking history. She has never used smokeless tobacco. She reports that she does not drink alcohol or use drugs.  Family History: family history is not on file. She was adopted.   Review  of Systems: A full review of systems was performed and negative except as noted in the HPI.     O:  BP 123/79   Pulse 95   Temp 98.2 F (36.8 C) (Oral)   Resp 17   Ht 5\' 4"  (1.626 m)   Wt (!) 142.1 kg   LMP 03/25/2019 (Exact Date)   BMI 53.76 kg/m  Results for orders placed or performed during the hospital encounter of 11/12/19 (from the past 48 hour(s))  Comprehensive metabolic panel   Collection Time: 11/12/19 12:54 PM  Result Value Ref Range   Sodium 135 135 - 145 mmol/L   Potassium 4.0 3.5 - 5.1 mmol/L   Chloride 107 98 - 111 mmol/L   CO2 18 (L) 22 - 32  mmol/L   Glucose, Bld 86 70 - 99 mg/dL   BUN 7 6 - 20 mg/dL   Creatinine, Ser 11/14/19 0.44 - 1.00 mg/dL   Calcium 9.2 8.9 - 4.74 mg/dL   Total Protein 7.0 6.5 - 8.1 g/dL   Albumin 3.2 (L) 3.5 - 5.0 g/dL   AST 15 15 - 41 U/L   ALT 18 0 - 44 U/L   Alkaline Phosphatase 88 38 - 126 U/L   Total Bilirubin 0.5 0.3 - 1.2 mg/dL   GFR calc non Af Amer >60 >60 mL/min   GFR calc Af Amer >60 >60 mL/min   Anion gap 10 5 - 15  CBC on admission   Collection Time: 11/12/19 12:54 PM  Result Value Ref Range   WBC 10.9 (H) 4.0 - 10.5 K/uL   RBC 4.34 3.87 - 5.11 MIL/uL   Hemoglobin 13.8 12.0 - 15.0 g/dL   HCT 11/14/19 56.3 - 87.5 %   MCV 87.3 80.0 - 100.0 fL   MCH 31.8 26.0 - 34.0 pg   MCHC 36.4 (H) 30.0 - 36.0 g/dL   RDW 64.3 32.9 - 51.8 %   Platelets 305 150 - 400 K/uL   nRBC 0.0 0.0 - 0.2 %  Protein / creatinine ratio, urine   Collection Time: 11/12/19  1:00 PM  Result Value Ref Range   Creatinine, Urine 82 mg/dL   Total Protein, Urine 7 mg/dL   Protein Creatinine Ratio 0.09 0.00 - 0.15 mg/mg[Cre]     Reviewed BPs and NST  Fetal  monitoring: Cat I Appropriate for GA- Reactive NST Baseline: 135bpm, moderate variability, no decels, + accels Variability: moderate Accelerations: present x >2 Decelerations absent Time 11/14/19    A/P: 28 y.o. [redacted]w[redacted]d here for antenatal surveillance for GHTN  Principle Diagnosis:  High risk pregnancy in third trimester   Severe range BP x 1 reported at home- normotensive on L&D.   Fetal Wellbeing: Reassuring Cat 1 tracing with reactive NST  D/c home stable, precautions reviewed by nursing, follow-up as scheduled.    [redacted]w[redacted]d, CNM 11/13/2019  6:47 AM

## 2019-11-14 LAB — RPR: RPR Ser Ql: NONREACTIVE

## 2019-11-14 LAB — TYPE AND SCREEN
ABO/RH(D): O POS
Antibody Screen: NEGATIVE

## 2019-11-14 LAB — SARS CORONAVIRUS 2 (TAT 6-24 HRS): SARS Coronavirus 2: NEGATIVE

## 2019-11-14 MED ORDER — ENOXAPARIN SODIUM 40 MG/0.4ML ~~LOC~~ SOLN
40.0000 mg | Freq: Every day | SUBCUTANEOUS | Status: DC
  Administered 2019-11-14 – 2019-11-15 (×2): 40 mg via SUBCUTANEOUS
  Filled 2019-11-14 (×2): qty 0.4

## 2019-11-14 MED ORDER — CALCIUM GLUCONATE 10 % IV SOLN
INTRAVENOUS | Status: AC
  Filled 2019-11-14: qty 10

## 2019-11-14 NOTE — Progress Notes (Signed)
Progress note:  Subjective: Pamela AdieMelissa S Longbottom is a 28 y.o. G1P0000 female at 2137w6d dated by US at 6456w4d. She presented to L&D yesterday for eval of headache and elevated BP. Known GHTN, eval with neg labs - including protein:creatinine ratio - yesterday. Reports active FM, denies UCs, LOF or VB.   On admission, elevated BP in the ER to 152/119 with HR 113. Since arrival to triage, all BP in mild or normal range.  She is on mag 4/2, started on admission. She received BMZ last night. Second dose scheduled tonight. Good fetal movement Cat I strip throughout the night, with 15x15 accels. Now with min variability on mag.  Pregnancy Issues: 1. Elevated early 1h GTT: 139  3h GTT scheduled: 78, 133, 106, 87 2. Morbid Obesity: BMI 54.5 at NOB  Anesthesia consult done, Mallampati score 2( [place] 09/17/2019) ; 35 wks for reassessment [ ]    Low dose ASA 81mg  daily, PNV with folic acid 1mg  daily  Hx Sleep apnea, not using CPAP- needs sleep study- scheduled 09/23/2019  Baseline CMP- wnl and P/C ratio-92  Early GTT Shakeel.Aas[139 ] and A1c: 4.8, 3 hour gtt scheduled-WNL  Growth scans starting at 28 weeks and q 4 wk . NST start at 36 weeks , [ ]  IOL  3. Gestational Hypertension  09/23/2019 6479w3d: 158/94, repeat 132/80  10/08/2019 3464w4d: 142/92, repeat 132/88  11/12/19: 30+4wks: 159/98, rpt 142/96--> to triage for NST and repeat labs.   10/29/19 CMP and P/C ratio added to 28w labs- creatinine: 0.6, normal LFTs, P/C 123  4. Amniotic band  Noted on 3039w4d ultrasound  Referral to MFM ordered 10/29/2019  Seen 12/14: no fetal parts persistently in proximity to this region 5. IUGR:  Measuring 342w2d at 7239w4d (EFW 16%) after measuring 4437w2d at anatomy scan at 3242w4d  [facility] EFW 6%:   Weekly dopplers and AFI, growth q3w--scheduled at MFM, and due tomorrow (Monday)   Maternal Medical History:   Past Medical History:  Diagnosis Date  . ADHD (attention deficit hyperactivity  disorder)   . Asthma   . Hypertension    Propanolol 2 pills bid, pt unaware of dose  . Mood disorder (HCC)   . Sleep apnea   Pt denies chronic HTN or previous medication for same, but records do say cHTN.    History reviewed. No pertinent surgical history.  Allergies  Allergen Reactions  . Other Other (See Comments)    Maple tree, dust, and rag weed  . Pollen Extract Other (See Comments)  . Latex Rash    Breaks out    Prior to Admission medications   Medication Sig Start Date End Date Taking? Authorizing Provider  albuterol (PROVENTIL HFA;VENTOLIN HFA) 108 (90 Base) MCG/ACT inhaler Inhale 2 puffs into the lungs.   Yes [provider]  Prenatal Vit-Fe Fumarate-FA (PRENATAL MULTIVITAMIN) TABS tablet Take 1 tablet by mouth daily at 12 noon.   Yes [provider]  cephALEXin (KEFLEX) 500 MG capsule Take 1 capsule (500 mg total) by mouth 3 (three) times daily. Patient not taking: Reported on 11/12/2019 07/25/19   Irean HongSung, Jade J, MD  EPINEPHrine 0.3 mg/0.3 mL IJ SOAJ injection Inject into the muscle.    [provider]     [facility]  Social History: She  reports that she has been smoking cigarettes. She started smoking about 12 years ago. She has a 12.00 pack-year smoking history. She has never used smokeless tobacco. She reports that she does not  drink alcohol or use drugs.  Family History: family history is not on file. She was adopted.   Review of Systems: A full review of systems was performed and negative except as noted in the HPI.  Particularly, no headache today.   Physical Exam:  Vital Signs: BP (!) 144/81   Pulse 86   Temp 98.2 F (36.8 C) (Oral)   Resp 19   Ht 5\' 4"  (1.626 m)   Wt (!) 142 kg   LMP 03/25/2019 (Exact Date)   SpO2 94%   BMI 53.73 kg/m  General: no acute distress.  HEENT: normocephalic, atraumatic Heart: regular rate & rhythm.  No murmurs/rubs/gallops Lungs: clear to auscultation  bilaterally, normal respiratory effort Abdomen: soft, gravid, non-tender;  EFW: 1218grams, EFW 6%, last growth US done 12/14 Pelvic: deferred  Extremities: non-tender, symmetric, 1+ LE edema bilaterally - ted hose in place.  DTRs: 1+. No clonus Neurologic: Alert & oriented x 3.    Results for orders placed or performed during the hospital encounter of 11/13/19 (from the past 24 hour(s))  Protein / creatinine ratio, urine     Status: None   Collection Time: 11/13/19  8:57 PM  Result Value Ref Range   Creatinine, Urine 268 mg/dL   Total Protein, Urine 16 mg/dL   Protein Creatinine Ratio 0.06 0.00 - 0.15 mg/mg[Cre]  Comprehensive metabolic panel     Status: Abnormal   Collection Time: 11/13/19  9:23 PM  Result Value Ref Range   Sodium 138 135 - 145 mmol/L   Potassium 3.6 3.5 - 5.1 mmol/L   Chloride 106 98 - 111 mmol/L   CO2 23 22 - 32 mmol/L   Glucose, Bld 78 70 - 99 mg/dL   BUN 9 6 - 20 mg/dL   Creatinine, Ser 0.54 0.44 - 1.00 mg/dL   Calcium 9.1 8.9 - 10.3 mg/dL   Total Protein 6.9 6.5 - 8.1 g/dL   Albumin 3.2 (L) 3.5 - 5.0 g/dL   AST 15 15 - 41 U/L   ALT 17 0 - 44 U/L   Alkaline Phosphatase 84 38 - 126 U/L   Total Bilirubin 0.4 0.3 - 1.2 mg/dL   GFR calc non Af Amer >60 >60 mL/min   GFR calc Af Amer >60 >60 mL/min   Anion gap 9 5 - 15  CBC     Status: Abnormal   Collection Time: 11/13/19  9:23 PM  Result Value Ref Range   WBC 13.5 (H) 4.0 - 10.5 K/uL   RBC 4.24 3.87 - 5.11 MIL/uL   Hemoglobin 13.4 12.0 - 15.0 g/dL   HCT 39.2 36.0 - 46.0 %   MCV 92.5 80.0 - 100.0 fL   MCH 31.6 26.0 - 34.0 pg   MCHC 34.2 30.0 - 36.0 g/dL   RDW 13.2 11.5 - 15.5 %   Platelets 314 150 - 400 K/uL   nRBC 0.0 0.0 - 0.2 %  Type and screen     Status: None   Collection Time: 11/13/19 11:01 PM  Result Value Ref Range   ABO/RH(D) O POS    Antibody Screen NEG    Sample Expiration      11/16/2019,2359 Performed at [facility], [address] [place], [place] [address]      Pertinent Results:  Prenatal Labs: Blood type/Rh  O Pos  Antibody screen neg  Rubella Immune  Varicella Immune  RPR NR  HBsAg Neg  HIV NR  GC neg  Chlamydia neg  Genetic screening  negative MaterniT21, neg AFP  1 hour GTT  139 early, 130 at 28wks  3 hour GTT  early: 78, 133, 106, 87  GBS Not done   FHT: 130bpm, moderate variability, + accels, no decels TOCO: none SVE: deferred   US OB Limited  Result Date: 11/13/2019 CLINICAL DATA:  Gestational hypertension in 3rd trimester pregnancy. IUGR. EXAM: LIMITED OBSTETRIC ULTRASOUND FINDINGS: Number of Fetuses: 1 Heart Rate:  135 bpm Movement: Yes Presentation: Breech Placental Location: Posterior Previa: No Amniotic Fluid (Subjective):  Within normal limits. AFI: 13.3 cm BPD: 7.0 cm 28 w  0 d MATERNAL FINDINGS: Cervix: Appears closed, and measures approximately 3.3 cm in length. IMPRESSION: Single living intrauterine fetus in breech presentation. Amniotic fluid volume within normal limits, with AFI of 13.3 cm. This exam is performed on an emergent basis and does not comprehensively evaluate fetal size, dating, or anatomy; follow-up complete OB US should be considered if further fetal assessment is warranted. Electronically Signed   By: Danae Orleans M.D.   On: 11/13/2019 22:04   [place] MFM OB DETAIL +14 WK  Result Date: 11/08/2019 ----------------------------------------------------------------------  OBSTETRICS REPORT                       (Signed Final 11/08/2019 12:13 pm) ---------------------------------------------------------------------- PATIENT INFO:  ID #:       [id number]                          D.O.B.:  25-Dec-1990 (28 yrs)  Name:       LAURY HUIZAR [facility]                Visit Date: 11/08/2019 09:04 am ---------------------------------------------------------------------- PERFORMED BY:  Performed By:     Bolivar Haw          Secondary Phy.:   London Sheer                                                             MD  Referred By:      Claris Che               Address:          [address]                    [place]                                                             [address], [place],  [place] [address] ---------------------------------------------------------------------- SERVICE(S) PROVIDED:   [place] MFM OB COMP + 14 WK                               76805.01  ---------------------------------------------------------------------- INDICATIONS:   [redacted] weeks gestation of pregnancy                Z3A.30   Amniotic band  ---------------------------------------------------------------------- FETAL EVALUATION:  Num Of Fetuses:         1  Fetal Heart Rate(bpm):  135  Cardiac Activity:       Present  Presentation:           Breech  Placenta:               Posterior, fundal  P. Cord Insertion:      Normal  AFI Sum(cm)     %Tile  11.9            29  Comment:    Band seen ruq ---------------------------------------------------------------------- BIOMETRY:  BPD:      67.4  mm     G. Age:  27w 1d        < 1  %    CI:        73.34   %    70 - 86                                                          FL/HC:      21.6   %    19.2 - 21.4  HC:      250.1  mm     G. Age:  27w 1d        < 3  %    HC/AC:      1.02        0.99 - 1.21  AC:      244.3  mm     G. Age:  28w 5d         12  %    FL/BPD:     80.0   %    71 - 87  FL:       53.9  mm     G. Age:  28w 4d          7  %    FL/AC:      22.1   %    20 - 24  HUM:      49.2  mm     G. Age:  29w 0d         30  %  CER:      31.4  mm     G. Age:  27w 3d          9  %  CM:        7.7  mm  Est. FW:    1218  gm    2 lb 11 oz       6  % ---------------------------------------------------------------------- GESTATIONAL AGE:  LMP:           32w 4d        Date:  03/25/19                 EDD:   12/30/19  U/S Today:     27w 6d                                        EDD:    02/01/20  Best:          30w 0d     Det. By:  Marcella Dubs         EDD:   01/17/20                                      (07/23/19) ---------------------------------------------------------------------- ANATOMY:  Cranium:               Within Normal Limits   Aortic Arch:            Normal appearance  Cavum:                 CSP visualized         Ductal Arch:            Not visualized  Ventricles:            Normal appearance      Diaphragm:              Within Normal Limits  Choroid Plexus:        Within Normal Limits   Stomach:                Seen  Cerebellum:            Within Normal Limits   Abdomen:                Within Normal                                                                        Limits  Posterior Fossa:       Within Normal Limits   Abdominal Wall:         Normal appearance  Nuchal Fold:           Beyond 22 weeks        Cord Vessels:           3 vessels                         gestation  Face:                  Orbits visualized      Kidneys:                Normal appearance  Lips:                  Normal appearance      Bladder:                Seen  Thoracic:              Within Normal Limits   Spine:                  Limited Views due  To Fetal Position  Heart:                 4-Chamber view         Upper Extremities:      Visualized                         appears normal  RVOT:                  Normal appearance      Lower Extremities:      Visualized  LVOT:                  Normal appearance ---------------------------------------------------------------------- IMPRESSION:  Dear Colleagues  Thank you for referring your patient for detailed anatomic  survey due to synechia.  History is also significant for  elevated maternal BMI.  Ultrasound demonstrates a singleton gestation at 30 weeks 0  days with subjectively normal amniotic fluid volume.  The estimated fetal weight is 1218g (6%). Due to fetal  position and advanced  gestational age, images of the sacral  spine, CSP, ductal arch, and open hands were suboptimally  seen.  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   No fetal parts are persistently in proximity to this  region throughout the duration of this scan.  The remainder of the detailed anatomic survey was  unremarkable.  The amniotic fluid volume was normal and  active fetal movements were seen.  Findings were reviewed. 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  The possible etiologies of IUGR were discussed at length  with the patient.  The majority of IUGR fetuses diagnosed  towards the end of the third trimester will either be  constitutionally small, or have growth restriction due to  placental insufficiency.  Other causes are aneuploidy, other  genetic disorders, maternal conditions such as hypertension,  medications/exposures like tobacco,  fetal malformations,  and fetal infection.    Doppler velocimetry is a valuable tool in  assessing small fetuses, and has been associated with a  decreased mortality in small fetuses.   Normal umbilical artery  doppler findings and BPP are very reassuring.  No difference in obstetrical outcome exists in pregnancies  where the presence of uterine synechia is noted.  Recommend weekly antenatal tesitng with dopplers and fluid  assessment. We will reassess fetal anatomy suboptimally  imaged at that time.   Follow up growth in 3 weeks.  Appointments have been scheduled at [facility].  Thank you for allowing [place] to participate in your patient's care.  Please do not hesitate to contact us if we can be of further  assistance. ----------------------------------------------------------------------                   Consuelo Pandy, MD Electronically Signed Final Report   11/08/2019 12:13 pm ----------------------------------------------------------------------   Assessment:  Pamela Benjamin is a 28 y.o. G1P0000 female at [redacted]w[redacted]d with  hx of gHTN (possible chronic HTN) with severe features (headache, home BP in severe range) which have resolved on monitoring and bedrest, IUGR, morbid obesity.   Plan:  PreE: Elevated BP with severe features by headache and a home blood pressure. Both have resolved with bed rest. No evidence of liver or kidney disfunction. Plts in normal range. Because of her gestational age and her resolving features, we will continue monitoring for now. BP q1hr while on mag. Strict Is and Os.  -  Management in house is indicated. -delivery for maternal or fetal indication as clinically indicated - antihypertensives as indicated per protocol, with delivery for failure to manage and lability. Not currently on maintenance  - delivery at 34 weeks likely indicated - Once on antenatal unit, BP monitoring q4 hrs - assess sx daily - accurate Ins and Outs - PreE labs at least twice weekly   Fetal status/fetal growth restriction: - currently reactive strip - will do twice daily NSTs once off continuous monitoring - twice weekly fluid checks - once weekly Dopplers on Mondays. She has standing appointment with MFM downstairs. - q3 week growth scans - neonatology consult pending. They are aware and are comfortable with delivery at our institution.  BMI 54 -s/p anesthesia consult with Mallampati score of 2. Currently delivery at our institution is appropriate - if surgery, recommend wound vac  VTE ppx: - Lovenox  qday - TED hose on - ambulation  Breech presentation: - pt aware of need for cesarean if persistent breech  Christeen Douglas, MD 11/14/19 10:11 AM

## 2019-11-14 NOTE — Consult Note (Signed)
Neonatology Consult to Antenatal Patient: 11/14/2019 1:09 PM    I was requested by Dr Pamela Benjamin to see this patient in order to provide antenatal counseling due to [facility] with severe features at 30 6/[redacted] weeks gestation.    Pamela Benjamin is a 28 y/o Primigravida who was admitted yesterday and is now 63 6/[redacted] weeks GA.   She is currently "not" having active labor.  Pregnancy complicated by morbid obesity (BMI 54.5), gestational hypertension, elevated early 1 hr GTT, amniotic band but no fetal parts persistently in proximity of the region and IUGR (EFW 6%).  She is getting Magnesium Sulfate, labetalol, hydralazine and received her first dose of BMZ last night.   I spoke with Pamela Benjamin Obs Room1.   We discussed in detail what to expect in case of possible delivery of the infant in the next few days including morbidity and mortality at this gestational age, usual delivery room resuscitation including intubation and surfactant administration in the DR.  Discussed possible respiratory complications and need for support including mechanical ventilation, IV access, sepsis work-up, NG/OG feedings ( benefits of BF and availability of DBM), risk for IVH with the potential for motor/cognitive deficits, length of stay and long-term outcome.  She had no questions at present time.  I also mentioned that in case infant may need mechanical ventilatory support for a longer duration there might be a chance that she will have to be transferred to a tertiary level NICU care most likely [facility].  Thank you for asking [place] to see this patient and allowing [place] to participate in her care.  Please call if there are any further questions.   _______________________ Amedeo Gory, MD (Attending Neonatologist)  Total length of face-to-face or floor/unit time for this encounter was 40 minutes. Counseling and/or coordination of care was greater than fifty percent of the time above.

## 2019-11-15 ENCOUNTER — Inpatient Hospital Stay (HOSPITAL_BASED_OUTPATIENT_CLINIC_OR_DEPARTMENT_OTHER)
Admission: RE | Admit: 2019-11-15 | Discharge: 2019-11-15 | Disposition: A | Discharge status: Home / Self Care | Payer: Medicaid Other | Source: Ambulatory Visit | Attending: Maternal & Fetal Medicine | Admitting: Maternal & Fetal Medicine

## 2019-11-15 ENCOUNTER — Ambulatory Visit
Admission: RE | Admit: 2019-11-15 | Discharge: 2019-11-15 | Disposition: A | Discharge status: Home / Self Care | Payer: Medicaid Other | Source: Ambulatory Visit | Attending: Maternal & Fetal Medicine | Admitting: Maternal & Fetal Medicine

## 2019-11-15 ENCOUNTER — Other Ambulatory Visit: Payer: Self-pay

## 2019-11-15 VITALS — BP 134/90 | HR 80 | Temp 97.3°F

## 2019-11-15 DIAGNOSIS — O365930 Maternal care for other known or suspected poor fetal growth, third trimester, not applicable or unspecified: Secondary | ICD-10-CM

## 2019-11-15 DIAGNOSIS — O1493 Unspecified pre-eclampsia, third trimester: Secondary | ICD-10-CM | POA: Diagnosis not present

## 2019-11-15 DIAGNOSIS — Z3A30 30 weeks gestation of pregnancy: Secondary | ICD-10-CM | POA: Diagnosis not present

## 2019-11-15 DIAGNOSIS — I10 Essential (primary) hypertension: Secondary | ICD-10-CM

## 2019-11-15 DIAGNOSIS — O99213 Obesity complicating pregnancy, third trimester: Secondary | ICD-10-CM

## 2019-11-15 DIAGNOSIS — Z3A31 31 weeks gestation of pregnancy: Secondary | ICD-10-CM

## 2019-11-15 DIAGNOSIS — Z6841 Body Mass Index (BMI) 40.0 and over, adult: Secondary | ICD-10-CM

## 2019-11-15 DIAGNOSIS — O133 Gestational [pregnancy-induced] hypertension without significant proteinuria, third trimester: Secondary | ICD-10-CM

## 2019-11-15 LAB — COMPREHENSIVE METABOLIC PANEL
ALT: 25 U/L (ref 0–44)
AST: 19 U/L (ref 15–41)
Albumin: 3.1 g/dL — ABNORMAL LOW (ref 3.5–5.0)
Alkaline Phosphatase: 73 U/L (ref 38–126)
Anion gap: 11 (ref 5–15)
BUN: 7 mg/dL (ref 6–20)
CO2: 19 mmol/L — ABNORMAL LOW (ref 22–32)
Calcium: 7.1 mg/dL — ABNORMAL LOW (ref 8.9–10.3)
Chloride: 105 mmol/L (ref 98–111)
Creatinine, Ser: 0.47 mg/dL (ref 0.44–1.00)
GFR calc Af Amer: >60 mL/min (ref >60)
GFR calc non Af Amer: >60 mL/min (ref >60)
Glucose, Bld: 139 mg/dL — ABNORMAL HIGH (ref 70–99)
Potassium: 3.9 mmol/L (ref 3.5–5.1)
Sodium: 135 mmol/L (ref 135–145)
Total Bilirubin: 0.6 mg/dL (ref 0.3–1.2)
Total Protein: 6.6 g/dL (ref 6.5–8.1)

## 2019-11-15 LAB — PROTEIN, URINE, 24 HOUR
Collection Interval-UPROT: 24 hours
Protein, Urine: <6 mg/dL
Urine Total Volume-UPROT: 2740 mL

## 2019-11-15 LAB — CBC
HCT: 35.6 % — ABNORMAL LOW (ref 36.0–46.0)
Hemoglobin: 12.1 g/dL (ref 12.0–15.0)
MCH: 31.3 pg (ref 26.0–34.0)
MCHC: 34 g/dL (ref 30.0–36.0)
MCV: 92.2 fL (ref 80.0–100.0)
Platelets: 275 10*3/uL (ref 150–400)
RBC: 3.86 MIL/uL — ABNORMAL LOW (ref 3.87–5.11)
RDW: 12.8 % (ref 11.5–15.5)
WBC: 11.5 10*3/uL — ABNORMAL HIGH (ref 4.0–10.5)
nRBC: 0 % (ref 0.0–0.2)

## 2019-11-15 NOTE — Progress Notes (Signed)
On arrival to [facility] BP 149/89, Pulse 80.  Recheck right after was 134/90. Dr. Jeneen Rinks notified.

## 2019-11-15 NOTE — Progress Notes (Signed)
Pt back from [facility].

## 2019-11-15 NOTE — ED Notes (Signed)
Pt back to OBS #1 via wheelchair by Theodis Sato, CNA. Report given to [facility], Therapist, sports.  Dr. Leonides Schanz paged to speak with Dr. Leonides Schanz who is in the OR at the moment. Dr. Leonides Schanz to call Dr. Jeneen Rinks back at [facility] when case over.

## 2019-11-15 NOTE — Discharge Instructions (Signed)
Hypertension, Adult Hypertension is another name for high blood pressure. High blood pressure forces your heart to work harder to pump blood. This can cause problems over time. There are two numbers in a blood pressure reading. There is a top number (systolic) over a bottom number (diastolic). It is best to have a blood pressure that is below 120/80. Healthy choices can help lower your blood pressure, or you may need medicine to help lower it. What are the causes? The cause of this condition is not known. Some conditions may be related to high blood pressure. What increases the risk?  Smoking.  Having type 2 diabetes mellitus, high cholesterol, or both.  Not getting enough exercise or physical activity.  Being overweight.  Having too much fat, sugar, calories, or salt (sodium) in your diet.  Drinking too much alcohol.  Having long-term (chronic) kidney disease.  Having a family history of high blood pressure.  Age. Risk increases with age.  Race. You may be at higher risk if you are African American.  Gender. Men are at higher risk than women before age 45. After age 65, women are at higher risk than men.  Having obstructive sleep apnea.  Stress. What are the signs or symptoms?  High blood pressure may not cause symptoms. Very high blood pressure (hypertensive crisis) may cause: ? Headache. ? Feelings of worry or nervousness (anxiety). ? Shortness of breath. ? Nosebleed. ? A feeling of being sick to your stomach (nausea). ? Throwing up (vomiting). ? Changes in how you see. ? Very bad chest pain. ? Seizures. How is this treated?  This condition is treated by making healthy lifestyle changes, such as: ? Eating healthy foods. ? Exercising more. ? Drinking less alcohol.  Your health care provider may prescribe medicine if lifestyle changes are not enough to get your blood pressure under control, and if: ? Your top number is above 130. ? Your bottom number is above  80.  Your personal target blood pressure may vary. Follow these instructions at home: Eating and drinking   If told, follow the DASH eating plan. To follow this plan: ? Fill one half of your plate at each meal with fruits and vegetables. ? Fill one fourth of your plate at each meal with whole grains. Whole grains include whole-wheat pasta, brown rice, and whole-grain bread. ? Eat or drink low-fat dairy products, such as skim milk or low-fat yogurt. ? Fill one fourth of your plate at each meal with low-fat (lean) proteins. Low-fat proteins include fish, chicken without skin, eggs, beans, and tofu. ? Avoid fatty meat, cured and processed meat, or chicken with skin. ? Avoid pre-made or processed food.  Eat less than 1,500 mg of salt each day.  Do not drink alcohol if: ? Your doctor tells you not to drink. ? You are pregnant, may be pregnant, or are planning to become pregnant.  If you drink alcohol: ? Limit how much you use to:  0-1 drink a day for women.  0-2 drinks a day for men. ? Be aware of how much alcohol is in your drink. In the [place] one drink equals one 12 oz bottle of beer (355 mL), one 5 oz glass of wine (148 mL), or one 1 oz glass of hard liquor (44 mL). Lifestyle   Work with your doctor to stay at a healthy weight or to lose weight. Ask your doctor what the best weight is for you.  Get at least 30 minutes of exercise most   days of the week. This may include walking, swimming, or biking.  Get at least 30 minutes of exercise that strengthens your muscles (resistance exercise) at least 3 days a week. This may include lifting weights or doing Pilates.  Do not use any products that contain nicotine or tobacco, such as cigarettes, e-cigarettes, and chewing tobacco. If you need help quitting, ask your doctor.  Check your blood pressure at home as told by your doctor.  Keep all follow-up visits as told by your doctor. This is important. Medicines  Take over-the-counter  and prescription medicines only as told by your doctor. Follow directions carefully.  Do not skip doses of blood pressure medicine. The medicine does not work as well if you skip doses. Skipping doses also puts you at risk for problems.  Ask your doctor about side effects or reactions to medicines that you should watch for. Contact a doctor if you:  Think you are having a reaction to the medicine you are taking.  Have headaches that keep coming back (recurring).  Feel dizzy.  Have swelling in your ankles.  Have trouble with your vision. Get help right away if you:  Get a very bad headache.  Start to feel mixed up (confused).  Feel weak or numb.  Feel faint.  Have very bad pain in your: ? Chest. ? Belly (abdomen).  Throw up more than once.  Have trouble breathing. Summary  Hypertension is another name for high blood pressure.  High blood pressure forces your heart to work harder to pump blood.  For most people, a normal blood pressure is less than 120/80.  Making healthy choices can help lower blood pressure. If your blood pressure does not get lower with healthy choices, you may need to take medicine. This information is not intended to replace advice given to you by your health care provider. Make sure you discuss any questions you have with your health care provider. Document Released: 04/29/2008 Document Revised: 07/22/2018 Document Reviewed: 07/22/2018 Elsevier Patient Education  2020 [employer] Inc[employer]   Hypertension During Pregnancy Hypertension is also called high blood pressure. High blood pressure means that the force of your blood moving in your body is too strong. It can cause problems for you and your baby. Different types of high blood pressure can happen during pregnancy. The types are:  High blood pressure before you got pregnant. This is called chronic hypertension.  This can continue during your pregnancy. Your doctor will want to keep checking your  blood pressure. You may need medicine to keep your blood pressure under control while you are pregnant. You will need follow-up visits after you have your baby.  High blood pressure that goes up during pregnancy when it was normal before. This is called gestational hypertension. It will usually get better after you have your baby, but your doctor will need to watch your blood pressure to make sure that it is getting better.  Very high blood pressure during pregnancy. This is called preeclampsia. Very high blood pressure is an emergency that needs to be checked and treated right away.  You may develop very high blood pressure after giving birth. This is called postpartum preeclampsia. This usually occurs within 48 hours after childbirth but may occur up to 6 weeks after giving birth. This is rare. How does this affect me? If you have high blood pressure during pregnancy, you have a higher chance of developing high blood pressure:  As you get older.  If you get pregnant again.  In some cases, high blood pressure during pregnancy can cause:  Stroke.  Heart attack.  Damage to the kidneys, lungs, or liver.  Preeclampsia.  Jerky movements you cannot control (convulsions or seizures).  Problems with the placenta. How does this affect my baby? Your baby may:  Be born early.  Not weigh as much as he or she should.  Not handle labor well, leading to a c-section birth. What are the risks?  Having high blood pressure during a past pregnancy.  Being overweight.  Being 59 years old or older.  Being pregnant for the first time.  Being pregnant with more than one baby.  Becoming pregnant using fertility methods, such as IVF.  Having other problems, such as diabetes, or kidney disease.  Having family members who have high blood pressure. What can I do to lower my risk?   Keep a healthy weight.  Eat a healthy diet.  Follow what your doctor tells you about treating any medical  problems that you had before becoming pregnant. It is very important to go to all of your doctor visits. Your doctor will check your blood pressure and make sure that your pregnancy is progressing as it should. Treatment should start early if a problem is found. How is this treated? Treatment for high blood pressure during pregnancy can differ depending on the type of high blood pressure you have and how serious it is.  You may need to take blood pressure medicine.  If you have been taking medicine for your blood pressure, you may need to change the medicine during pregnancy if it is not safe for your baby.  If your doctor thinks that you could get very high blood pressure, he or she may tell you to take a low-dose aspirin during your pregnancy.  If you have very high blood pressure, you may need to stay in the hospital so you and your baby can be watched closely. You may also need to take medicine to lower your blood pressure. This medicine may be given by mouth or through an IV tube.  In some cases, if your condition gets worse, you may need to have your baby early. Follow these instructions at home: Eating and drinking   Drink enough fluid to keep your pee (urine) pale yellow.  Avoid caffeine. Lifestyle  Do not use any products that contain nicotine or tobacco, such as cigarettes, e-cigarettes, and chewing tobacco. If you need help quitting, ask your doctor.  Do not use alcohol or drugs.  Avoid stress.  Rest and get plenty of sleep.  Regular exercise can help. Ask your doctor what kinds of exercise are best for you. General instructions  Take over-the-counter and prescription medicines only as told by your doctor.  Keep all prenatal and follow-up visits as told by your doctor. This is important. Contact a doctor if:  You have symptoms that your doctor told you to watch for, such as: ? Headaches. ? Nausea. ? Vomiting. ? Belly (abdominal)  pain. ? Dizziness. ? Light-headedness. Get help right away if:  You have: ? Very bad belly pain that does not get better with treatment. ? A very bad headache that does not get better. ? Vomiting that does not get better. ? Sudden, fast weight gain. ? Sudden swelling in your hands, ankles, or face. ? Bleeding from your vagina. ? Blood in your pee. ? Blurry vision. ? Double vision. ? Shortness of breath. ? Chest pain. ? Weakness on one side of your body. ?  Trouble talking.  Your baby is not moving as much as usual. Summary  High blood pressure is also called hypertension.  High blood pressure means that the force of your blood moving in your body is too strong.  High blood pressure can cause problems for you and your baby.  Keep all follow-up visits as told by your doctor. This is important. This information is not intended to replace advice given to you by your health care provider. Make sure you discuss any questions you have with your health care provider. Document Released: 12/14/2010 Document Revised: 03/04/2019 Document Reviewed: 12/08/2018 Elsevier Patient Education  2020 [employer]

## 2019-11-15 NOTE — ED Notes (Signed)
Pt had scheduled appt at [facility] today.  Theodis Sato, CNA transported pt from OBS #1 to [facility] via wheelchair for [place] appt and COnsult with Dr. Jeneen Rinks.

## 2019-11-15 NOTE — Progress Notes (Addendum)
[facility] Consultation   Chief Complaint: gestational HTN  HPI: Pamela Benjamin is a 28 y.o. G1P0000 at [redacted]w[redacted]d by [redacted]w[redacted]d [place] who presents in consultation from [facility] and Dr. Vikki Ports Benjamin for recommendations regarding preeclampsia.  She presented to L&D 11/13/2019 for eval of headache and elevated BP.  The patient had a BP in the ED as high as 152/119 with HR 113. The patient was admitted and started magnesium sulfate.  Since admission, her BPs have ranged from 120s-140s/60s-90s.  P/C ratio was normal.  Her magnesium sulfated was discontinued this morning.  She is S/P steroids.  Her pregnancy has been complicated by BMI  > 50 (most recently 69) and gestational HTN, uterine synechiae on [place] and fetal growth restriction (6th percentile on 11/08/2019 [facility] 12th percentile]).  GTT was normal   The patient reports that her BP was elevated even before pregnancy, but not so high as to require treatment.  At the moment, her headache has resolved with Tylenol.  She denies other symptoms of preeclampsia.  Obstetric History:  OB History  Gravida Para Term Preterm AB Living  1 0 0 0 0 0  SAB TAB Ectopic Multiple Live Births  0 0 0 0 0    # Outcome Date GA Lbr Len/2nd Weight Sex Delivery Anes PTL Lv  1 Current             Gynecologic History:  Patient's last menstrual period was 03/25/2019 (exact date).   Past Medical History: Patient  has a past medical history of ADHD (attention deficit hyperactivity disorder), Asthma, Hypertension, Mood disorder ([place]), and Sleep apnea.   Past Surgical History: She  has no past surgical history on file.   Medications:  Current Facility-Administered Medications on File Prior to Encounter  Medication Dose Route Frequency Provider Last Rate Last Admin  . acetaminophen (TYLENOL) tablet 650 mg  650 mg Oral Q4H PRN Benjamin, Pamela Hodgkins, CNM   650 mg at 11/14/19 2013  . enoxaparin (LOVENOX) injection 40 mg  40 mg Subcutaneous Daily  Pamela Kindler, MD   40 mg at 11/14/19 2056  . labetalol (NORMODYNE) injection 20 mg  20 mg Intravenous PRN Benjamin, Pamela A, CNM       And  . labetalol (NORMODYNE) injection 40 mg  40 mg Intravenous PRN Benjamin, Pamela A, CNM       And  . labetalol (NORMODYNE) injection 80 mg  80 mg Intravenous PRN Benjamin, Pamela A, CNM       And  . hydrALAZINE (APRESOLINE) injection 10 mg  10 mg Intravenous PRN Benjamin, Pamela Hodgkins, CNM      . lactated ringers infusion   Intravenous Continuous Benjamin, Pamela Hodgkins, CNM 75 mL/hr at 11/15/19 0000 New Bag at 11/15/19 0000   Current Outpatient Medications on File Prior to Encounter  Medication Sig Dispense Refill  . albuterol (PROVENTIL HFA;VENTOLIN HFA) 108 (90 Base) MCG/ACT inhaler Inhale 2 puffs into the lungs.    . cephALEXin (KEFLEX) 500 MG capsule Take 1 capsule (500 mg total) by mouth 3 (three) times daily. (Patient not taking: Reported on 11/12/2019) 21 capsule 0  . EPINEPHrine 0.3 mg/0.3 mL IJ SOAJ injection Inject into the muscle.    . Prenatal Vit-Fe Fumarate-FA (PRENATAL MULTIVITAMIN) TABS tablet Take 1 tablet by mouth daily at 12 noon.       Allergies: Patient is allergic to other; pollen extract; and latex.   Social History: Patient  reports that she has been smoking cigarettes. She  started smoking about 12 years ago. She has a 12.00 pack-year smoking history. She has never used smokeless tobacco. She reports that she does not drink alcohol or use drugs.   Family History: family history is not on file. She was adopted.   Review of Systems A full 12 point review of systems was negative or as noted in the History of Present Illness.  Physical Exam: Blood pressure 134/90, pulse 80, temperature (!) 97.3 F (36.3 C), last menstrual period 03/25/2019.  BP 149/89 on arrival to the [facility] suite.  134/90 on repeat.   Lungs - clear Heart - RRR Abdomen - NT  CMP normal today.  Creat 0.47 CBC normal today:    Component Value Date/Time   WBC  11.5 (H) 11/15/2019 0748   RBC 3.86 (L) 11/15/2019 0748   HGB 12.1 11/15/2019 0748   HCT 35.6 (L) 11/15/2019 0748   PLT 275 11/15/2019 0748   MCV 92.2 11/15/2019 0748   MCH 31.3 11/15/2019 0748   MCHC 34.0 11/15/2019 0748   RDW 12.8 11/15/2019 0748  24 hr urine protein > 6 mg/dL  [place] today - Breech presentation, AFI - 13.3 cm, BPP 8/8, normal Dopplers  Asessement: 1. Mild HTN - chronic versus gestational - no current symptoms or laboratory findings of preeclampsia 2. Fetal growth restriction 3. BMI > 50 4. Breech presentation    Recommendations: 1. Mild HTN - chronic versus gestational - no current symptoms or laboratory findings of preeclampsia  I think the patient could be managed as an outpatient with close follow-up and fetal surveillance as below  Delivery at 37 weeks or sooner prn 2. Fetal growth restriction  USs for growth every 3 weeks (the next is scheduled at [facility] in 2 weeks), twice weekly testing, weekly AFI and Dopplers (next AFI and Dopplers scheduled at [facility] in 1 week).   3. BMI > 50  Fetal surveillance as above  Thromboprophylaxis with pneumatic compression devices while an inpatient and in labor (or during cesarean) with enoxaparin 80 mg Collins qd (adjusted for weight) starting 12 hours after a vaginal delivery or 24 hours after a cesarean delivery and continuing for 6 weeks. 4. Breech presentation  For cesarean delivery if breech persists or ECV fails.     Total time spent with the patient was 40 minutes with greater than 50% spent in counseling and coordination of care.  We appreciate this interesting consult and will be happy to be involved in the ongoing care of [name] in anyway her obstetricians desire.  Pamela Ponder, MD [facility]

## 2019-11-15 NOTE — Progress Notes (Signed)
Pt off unit for [facility] appt

## 2019-11-15 NOTE — Progress Notes (Signed)
Mag D/C at 0747 per provider order. Continuous FM D/C per provider order. Will obtain daily NST. Pt waiting for 11am appointment with [facility] this am. Vitals WDL and patient resting comfortably.

## 2019-11-16 ENCOUNTER — Other Ambulatory Visit: Payer: Self-pay | Admitting: Obstetrics and Gynecology

## 2019-11-16 DIAGNOSIS — O365990 Maternal care for other known or suspected poor fetal growth, unspecified trimester, not applicable or unspecified: Secondary | ICD-10-CM

## 2019-11-18 ENCOUNTER — Encounter: Payer: Self-pay | Admitting: Obstetrics and Gynecology

## 2019-11-18 ENCOUNTER — Observation Stay
Admission: RE | Admit: 2019-11-18 | Discharge: 2019-11-18 | Disposition: A | Discharge status: Home / Self Care | Payer: Medicaid Other | Attending: Certified Nurse Midwife | Admitting: Certified Nurse Midwife

## 2019-11-18 DIAGNOSIS — O09893 Supervision of other high risk pregnancies, third trimester: Secondary | ICD-10-CM | POA: Diagnosis present

## 2019-11-18 DIAGNOSIS — Z79899 Other long term (current) drug therapy: Secondary | ICD-10-CM | POA: Diagnosis not present

## 2019-11-18 DIAGNOSIS — G4730 Sleep apnea, unspecified: Secondary | ICD-10-CM | POA: Diagnosis not present

## 2019-11-18 DIAGNOSIS — F17210 Nicotine dependence, cigarettes, uncomplicated: Secondary | ICD-10-CM | POA: Diagnosis not present

## 2019-11-18 DIAGNOSIS — O139 Gestational [pregnancy-induced] hypertension without significant proteinuria, unspecified trimester: Secondary | ICD-10-CM | POA: Diagnosis present

## 2019-11-18 DIAGNOSIS — J45909 Unspecified asthma, uncomplicated: Secondary | ICD-10-CM | POA: Diagnosis not present

## 2019-11-18 DIAGNOSIS — O99513 Diseases of the respiratory system complicating pregnancy, third trimester: Secondary | ICD-10-CM | POA: Insufficient documentation

## 2019-11-18 DIAGNOSIS — Z3A31 31 weeks gestation of pregnancy: Secondary | ICD-10-CM | POA: Insufficient documentation

## 2019-11-18 DIAGNOSIS — O133 Gestational [pregnancy-induced] hypertension without significant proteinuria, third trimester: Secondary | ICD-10-CM | POA: Diagnosis not present

## 2019-11-18 NOTE — OB Triage Note (Signed)
Pt reports for scheduled NST and BP check. Pt reports +FM. Denies LOF/bleeding/CTX. Reports no HA/blurry vision. No complaints. VSS. Sequence BP. Will continue to monitor

## 2019-11-18 NOTE — Discharge Summary (Signed)
Triage visit for NST   Pamela Benjamin is a 28 y.o. female. She is at [redacted]w[redacted]d gestation. She presents for a scheduled NST.  Patient's last menstrual period was 03/25/2019 (exact date). Estimated Date of Delivery: 01/17/20  Prenatal care site: [facility]    Chief Complaint: high risk pregnancy, gestational hypertension, need for antepartum surveillance  NST indication: gestational hypertension, BMI >50, fetal growth restriction  Subjective: Resting comfortably. No contractions/cramping, vaginal bleeding, or leakage of fluid. Active fetal movement.   Maternal Medical History:   Past Medical History:  Diagnosis Date  . ADHD (attention deficit hyperactivity disorder)   . Asthma   . Hypertension    Propanolol 2 pills bid, pt unaware of dose  . Mood disorder ([place])   . Sleep apnea     History reviewed. No pertinent surgical history.  Allergies  Allergen Reactions  . Other Other (See Comments)    Maple tree, dust, and rag weed  . Pollen Extract Other (See Comments)  . Latex Rash    Breaks out    Prior to Admission medications   Medication Sig Start Date End Date Taking? Authorizing Provider  albuterol (PROVENTIL HFA;VENTOLIN HFA) 108 (90 Base) MCG/ACT inhaler Inhale 2 puffs into the lungs.   Yes [provider]  Prenatal Vit-Fe Fumarate-FA (PRENATAL MULTIVITAMIN) TABS tablet Take 1 tablet by mouth daily at 12 noon.   Yes [provider]  EPINEPHrine 0.3 mg/0.3 mL IJ SOAJ injection Inject into the muscle.    [provider]     Social History:  reports that she has been smoking cigarettes. She started smoking about 12 years ago. She has a 12.00 pack-year smoking history. She has never used smokeless tobacco. She reports that she does not drink alcohol or use drugs.  Family History: family history is not on file. She was adopted.   Review of Systems: A full review of systems was performed and negative except as noted in the HPI.     Objective:   BP 132/74   Pulse 78   Temp 98.3 F (36.8 C) (Oral)   Resp 17   Ht 5\' 4"  (1.626 m)   Wt (!) 142 kg   LMP 03/25/2019 (Exact Date)   BMI 53.74 kg/m  No results found for this or any previous visit (from the past 64 hour(s)).    NST Baseline: 135bpm Variability: moderate Accelerations: 15x15 present x >2 Decelerations: absent Time: 43mins  Interpretation: reactive NST, category 1 tracing  Assessment:  28 y.o. [redacted]w[redacted]d with high risk pregnancy and antepartum surveillance.   Principle Diagnosis:  High risk pregnancy in third trimester, gestational hypertension, BMI>50, fetal growth restriction  Plan:  Reactive NST, with moderate variability and accelerations, no decels  Fetal Wellbeing: Reassuring  D/c home stable, precautions reviewed, follow-up as scheduled.   ----- Lisette Grinder, CNM, WHNP-BC [facility], Department of [facility]

## 2019-11-22 ENCOUNTER — Inpatient Hospital Stay
Admission: RE | Admit: 2019-11-22 | Discharge: 2019-11-22 | Disposition: A | Discharge status: Home / Self Care | Payer: Medicaid Other | Attending: Obstetrics and Gynecology | Admitting: Obstetrics and Gynecology

## 2019-11-22 ENCOUNTER — Ambulatory Visit
Admission: RE | Admit: 2019-11-22 | Discharge: 2019-11-22 | Disposition: A | Discharge status: Home / Self Care | Payer: Medicaid Other | Source: Ambulatory Visit | Attending: Obstetrics and Gynecology | Admitting: Obstetrics and Gynecology

## 2019-11-22 ENCOUNTER — Other Ambulatory Visit: Payer: Self-pay

## 2019-11-22 DIAGNOSIS — O418X30 Other specified disorders of amniotic fluid and membranes, third trimester, not applicable or unspecified: Secondary | ICD-10-CM | POA: Insufficient documentation

## 2019-11-22 DIAGNOSIS — O0993 Supervision of high risk pregnancy, unspecified, third trimester: Secondary | ICD-10-CM | POA: Insufficient documentation

## 2019-11-22 DIAGNOSIS — Z3A32 32 weeks gestation of pregnancy: Secondary | ICD-10-CM | POA: Insufficient documentation

## 2019-11-22 DIAGNOSIS — O365990 Maternal care for other known or suspected poor fetal growth, unspecified trimester, not applicable or unspecified: Secondary | ICD-10-CM

## 2019-11-22 NOTE — Progress Notes (Signed)
[facility] Return Consultation   Chief Complaint: hypertension ( chronic versus preeclampsia )  and FGR in pregnancy , Breech presentation , BMI 53 , asthma  HPI: [name] is a 28 y.o. G1P0000 at [redacted]w[redacted]d by Earliest scan  who presents in consultation from [facility] clinic - Dr Elesa Massed    for hypertension ( chronic versus preeclampsia )  and FGR ( 1218 g 6th %ile on 11/08/19, normal fluid and dopplers )  in pregnancy , Breech presentation , BMI 53  See Dr Fayrene Fearing note from 12/21 :Shepresentedto L&D12/19/2020for eval of headache and elevated BP.  The patient had a BP in the ED as high as 152/119 with HR 113. The patient was admitted and started magnesium sulfate.  Since admission, her BPs have ranged from 120s-140s/60s-90s.  P/C ratio was normal.  Her magnesium sulfated was discontinued 11/14/09.  She is S/P steroids.The patient reports that her BP was elevated even before pregnancy, but not so high as to require treatment.  Pt denies symptoms of preeclampsia no headache no scotomata.  She does not note changes in swelling in her hands or feet.  Her blood pressure was quite labile when she arrived at the [facility] clinic but normalized during her NST on labor and delivery  Past Medical History: Patient  has a past medical history of ADHD (attention deficit hyperactivity disorder), Asthma, Hypertension, Mood disorder (HCC), and Sleep apnea.  Past Surgical History: She  has no past surgical history on file.  Obstetric History:  OB History    Gravida  1   Para  0   Term  0   Preterm  0   AB  0   Living  0     SAB  0   TAB  0   Ectopic  0   Multiple  0   Live Births  0          Gynecologic History:  Patient's last menstrual period was 03/25/2019 (exact date).    Medications:  Current Outpatient Medications:  .  albuterol (PROVENTIL HFA;VENTOLIN HFA) 108 (90 Base) MCG/ACT inhaler, Inhale 2 puffs into the lungs., Disp: , Rfl:  .  EPINEPHrine 0.3  mg/0.3 mL IJ SOAJ injection, Inject into the muscle., Disp: , Rfl:  .  Prenatal Vit-Fe Fumarate-FA (PRENATAL MULTIVITAMIN) TABS tablet, Take 1 tablet by mouth daily at 12 noon., Disp: , Rfl:  Allergies: Patient is allergic to other; pollen extract; and latex.  Social History: Patient  reports that she has been smoking cigarettes. She started smoking about 13 years ago. She has a 12.00 pack-year smoking history. She has never used smokeless tobacco. She reports that she does not drink alcohol or use drugs.  Family History: family history is not on file. She was adopted.  Review of Systems A full 12 point review of systems was negative or as noted in the History of Present Illness.  Physical Exam: BP (!) 124/95   Pulse 99   Temp 98.2 F (36.8 C)   Wt (!) 138.1 kg   LMP 03/25/2019 (Exact Date)   BMI 52.27 kg/m  Labile at [facility] 145/98, 126/100   Repeated at the [facility] 136/84, 128/80 ,125/84 , 126 /72  Reactive NST BPP 8 out of 10 -2 breathing movements Normal AFI and Dopplers  Asessement: 1. Mild HTN - chronic versus gestational - neg urine protein on 12/20 , normal plt and CMP denies HA and visual sxs  2. Fetal growth restriction 6th %  ile 3. BMI > 50 4. Breech presentation    Recommendations: 1. Mild HTN - chronic versus gestational - no current symptoms or laboratory findings of preeclampsia  Agree with outpatient management with close follow-up 2. Fetal growth restriction  USs for growth every 3 weeks (the next is scheduled at [facility] in 1 week), twice weekly testing, weekly AFI and Dopplers (next AFI and Dopplers scheduled at [facility] in 1 week).   3. BMI > 50  Fetal surveillance as above  Thromboprophylaxis with pneumatic compression devices while an inpatient and in labor (or during cesarean) with enoxaparin 80 mg [place] qd (adjusted for weight) starting 12 hours after a vaginal delivery or 24 hours after a cesarean delivery and continuing for 6  weeks. 4. Breech presentation  Consider cesarean delivery if breech persists - given other medical issues FGR and BMI over 50 - ECV would be challenging.       Total time spent with the patient was 30 minutes with greater than 50% spent in counseling and coordination of care. We appreciate this interesting consult and will be happy to be involved in the ongoing care of Pamela Benjamin in anyway her obstetricians desire.  Lulani Bour, [facility]

## 2019-11-22 NOTE — Progress Notes (Signed)
Pt BP checked 3 times on arrival to [place] 1st- Left Lower Arm BP-145/98, P-85 2nd Right Lower Arm BP-126/100, P-87 3rd Left Lower Arm BP 124/95, P-99

## 2019-11-29 ENCOUNTER — Other Ambulatory Visit: Payer: Self-pay

## 2019-11-29 ENCOUNTER — Observation Stay
Admission: RE | Admit: 2019-11-29 | Discharge: 2019-11-29 | Disposition: A | Discharge status: Home / Self Care | Payer: Medicaid Other

## 2019-11-29 ENCOUNTER — Encounter: Payer: Self-pay | Admitting: Obstetrics and Gynecology

## 2019-11-29 DIAGNOSIS — Z3A33 33 weeks gestation of pregnancy: Secondary | ICD-10-CM | POA: Diagnosis not present

## 2019-11-29 DIAGNOSIS — O163 Unspecified maternal hypertension, third trimester: Secondary | ICD-10-CM | POA: Insufficient documentation

## 2019-11-29 DIAGNOSIS — Z3689 Encounter for other specified antenatal screening: Principal | ICD-10-CM | POA: Insufficient documentation

## 2019-11-29 DIAGNOSIS — O365930 Maternal care for other known or suspected poor fetal growth, third trimester, not applicable or unspecified: Secondary | ICD-10-CM

## 2019-11-29 NOTE — Discharge Summary (Signed)
   Pamela Benjamin is a 29 y.o. G1P0000. She is at [redacted]w[redacted]d gestation.  Indication: Scheduled NST for Gestational HTN and IUGR  S: Resting comfortably. no CTX, no VB. Endorses fetal movement.    BP 140/84   Pulse 91   Temp 98.3 F (36.8 C) (Oral)   Resp 16   Ht 5\' 4"  (1.626 m)   Wt (!) 140.6 kg   LMP 03/25/2019 (Exact Date)   BMI 53.21 kg/m  No results found for this or any previous visit (from the past 48 hour(s)).   Gen: NAD, AAOx3      Abd: FNTTP      Ext: Non-tender, Nonedmeatous    NST: Baseline: 135bpm Variability: moderate Accels: >2 15x15 Decels: absent Toco: Quiet Category 1 NST Reactive   A/P:  29 y.o. G1P0000 [redacted]w[redacted]d with scheduled NST.   Category 1 tracing   Fetal Wellbeing: NST is Reassuring reactive tracing   D/c home stable, precautions reviewed, follow-up as scheduled.   [redacted]w[redacted]d, CNM

## 2019-11-29 NOTE — OB Triage Note (Signed)
Pt. presents for scheduled NST/blood pressure check. She denies CTX/LOF/bleeding. Positive fetal movement stated. She does state that she has a headache rated 3/10 that has started with her ear infection that has been ongoing for a few days. She does experience some relief with Tylenol, She reports bilateral ear throbbing, rated 10/10. No cough, fever, or congestion. No blurry vision or epigastric pain. Last BP 138/97, HR 104. Pt. Resting comfortably and stable at this time.

## 2019-12-02 ENCOUNTER — Other Ambulatory Visit: Payer: Medicaid Other

## 2019-12-02 ENCOUNTER — Ambulatory Visit
Admission: RE | Admit: 2019-12-02 | Discharge: 2019-12-02 | Disposition: A | Discharge status: Home / Self Care | Payer: Medicaid Other | Source: Ambulatory Visit | Attending: Maternal & Fetal Medicine | Admitting: Maternal & Fetal Medicine

## 2019-12-02 ENCOUNTER — Other Ambulatory Visit: Payer: Self-pay

## 2019-12-02 DIAGNOSIS — Z3A33 33 weeks gestation of pregnancy: Secondary | ICD-10-CM | POA: Insufficient documentation

## 2019-12-02 DIAGNOSIS — O365930 Maternal care for other known or suspected poor fetal growth, third trimester, not applicable or unspecified: Secondary | ICD-10-CM | POA: Insufficient documentation

## 2019-12-06 ENCOUNTER — Ambulatory Visit
Admission: RE | Admit: 2019-12-06 | Discharge: 2019-12-06 | Disposition: A | Discharge status: Home / Self Care | Payer: Medicaid Other | Source: Ambulatory Visit | Attending: Maternal & Fetal Medicine | Admitting: Maternal & Fetal Medicine

## 2019-12-06 ENCOUNTER — Other Ambulatory Visit: Payer: Self-pay

## 2019-12-06 DIAGNOSIS — O365930 Maternal care for other known or suspected poor fetal growth, third trimester, not applicable or unspecified: Secondary | ICD-10-CM

## 2019-12-06 NOTE — Progress Notes (Signed)
NST procedure 29 yo G1 at 34 weeks with gHTN, IUGR and BMI 53 Vitals:   12/06/19 1018  BP: 121/70  Pulse: 75  Temp: 98.1 F (36.7 C)    Start time. 10:24 End time. 1054  Baseline 125 reactive, moderate variability No contractions

## 2019-12-09 ENCOUNTER — Other Ambulatory Visit: Payer: Self-pay

## 2019-12-09 ENCOUNTER — Ambulatory Visit
Admission: RE | Admit: 2019-12-09 | Discharge: 2019-12-09 | Disposition: A | Discharge status: Home / Self Care | Payer: Medicaid Other | Source: Ambulatory Visit | Attending: Obstetrics and Gynecology | Admitting: Obstetrics and Gynecology

## 2019-12-09 ENCOUNTER — Ambulatory Visit (HOSPITAL_BASED_OUTPATIENT_CLINIC_OR_DEPARTMENT_OTHER)
Admission: RE | Admit: 2019-12-09 | Discharge: 2019-12-09 | Disposition: A | Discharge status: Home / Self Care | Payer: Medicaid Other | Source: Ambulatory Visit | Attending: Obstetrics and Gynecology | Admitting: Obstetrics and Gynecology

## 2019-12-09 DIAGNOSIS — O99213 Obesity complicating pregnancy, third trimester: Secondary | ICD-10-CM | POA: Diagnosis not present

## 2019-12-09 DIAGNOSIS — O99343 Other mental disorders complicating pregnancy, third trimester: Secondary | ICD-10-CM | POA: Diagnosis not present

## 2019-12-09 DIAGNOSIS — Z5900 Homelessness unspecified: Secondary | ICD-10-CM

## 2019-12-09 DIAGNOSIS — O99333 Smoking (tobacco) complicating pregnancy, third trimester: Secondary | ICD-10-CM | POA: Diagnosis not present

## 2019-12-09 DIAGNOSIS — J45909 Unspecified asthma, uncomplicated: Secondary | ICD-10-CM | POA: Diagnosis not present

## 2019-12-09 DIAGNOSIS — Z79899 Other long term (current) drug therapy: Secondary | ICD-10-CM | POA: Diagnosis not present

## 2019-12-09 DIAGNOSIS — O99211 Obesity complicating pregnancy, first trimester: Secondary | ICD-10-CM

## 2019-12-09 DIAGNOSIS — I10 Essential (primary) hypertension: Secondary | ICD-10-CM | POA: Diagnosis not present

## 2019-12-09 DIAGNOSIS — O321XX0 Maternal care for breech presentation, not applicable or unspecified: Secondary | ICD-10-CM | POA: Insufficient documentation

## 2019-12-09 DIAGNOSIS — O99513 Diseases of the respiratory system complicating pregnancy, third trimester: Secondary | ICD-10-CM | POA: Insufficient documentation

## 2019-12-09 DIAGNOSIS — O139 Gestational [pregnancy-induced] hypertension without significant proteinuria, unspecified trimester: Secondary | ICD-10-CM | POA: Diagnosis present

## 2019-12-09 DIAGNOSIS — F909 Attention-deficit hyperactivity disorder, unspecified type: Secondary | ICD-10-CM | POA: Insufficient documentation

## 2019-12-09 DIAGNOSIS — F39 Unspecified mood [affective] disorder: Secondary | ICD-10-CM | POA: Insufficient documentation

## 2019-12-09 DIAGNOSIS — G4730 Sleep apnea, unspecified: Secondary | ICD-10-CM | POA: Insufficient documentation

## 2019-12-09 DIAGNOSIS — O365930 Maternal care for other known or suspected poor fetal growth, third trimester, not applicable or unspecified: Secondary | ICD-10-CM

## 2019-12-09 DIAGNOSIS — Z3A31 31 weeks gestation of pregnancy: Secondary | ICD-10-CM | POA: Insufficient documentation

## 2019-12-09 DIAGNOSIS — O133 Gestational [pregnancy-induced] hypertension without significant proteinuria, third trimester: Secondary | ICD-10-CM | POA: Insufficient documentation

## 2019-12-09 DIAGNOSIS — IMO0002 Reserved for concepts with insufficient information to code with codable children: Secondary | ICD-10-CM | POA: Insufficient documentation

## 2019-12-09 LAB — PROTEIN / CREATININE RATIO, URINE
Creatinine, Urine: 331 mg/dL
Protein Creatinine Ratio: 0.08 mg/mg{Cre} (ref 0.00–0.15)
Total Protein, Urine: 28 mg/dL

## 2019-12-09 NOTE — Addendum Note (Signed)
Encounter addended by: Jimmey Ralph, MD on: 12/09/2019 3:01 PM  Actions taken: Level of Service modified
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
## 2019-12-09 NOTE — Progress Notes (Signed)
Pt BP recheck while in [facility] was 135/99 and pulse was 71.Dr. Leatha Gilding notified.

## 2019-12-09 NOTE — Addendum Note (Signed)
Encounter addended by: Jimmey Ralph, MD on: 12/09/2019 3:33 PM  Actions taken: Clinical Note Signed

## 2019-12-13 ENCOUNTER — Encounter
Admission: RE | Admit: 2019-12-13 | Discharge: 2019-12-13 | Disposition: A | Discharge status: Home / Self Care | Payer: Medicaid Other | Source: Ambulatory Visit | Attending: Anesthesiology | Admitting: Anesthesiology

## 2019-12-13 ENCOUNTER — Other Ambulatory Visit: Payer: Self-pay

## 2019-12-13 NOTE — Consult Note (Signed)
[facility] Anesthesia Consultation  Pamela Benjamin BSJ:[id number] DOB: [birth year] DOA: 12/13/2019 PCP: Lyndon Code, MD   Requesting physician: Dr. Dalbert Garnet Date of consultation: 12/13/19 Reason for consultation: Obesity during pregnancy  CHIEF COMPLAINT:  Obesity during pregnancy  HISTORY OF PRESENT ILLNESS: Pamela Benjamin  is a 29 y.o. female with a known history of obesity in pregnancy. Recent high BP but not on any antihypertensives.   PAST MEDICAL HISTORY:   Past Medical History:  Diagnosis Date  . ADHD (attention deficit hyperactivity disorder)   . Asthma   . Hypertension    Propanolol 2 pills bid, pt unaware of dose  . Mood disorder (HCC)   . Sleep apnea     PAST SURGICAL HISTORY:  Past Surgical History:  Procedure Laterality Date  . NO PAST SURGERIES      SOCIAL HISTORY:  Social History   Tobacco Use  . Smoking status: Current Every Day Smoker    Packs/day: 1.00    Years: 12.00    Pack years: 12.00    Types: Cigarettes    Start date: 11/25/2006  . Smokeless tobacco: Never Used  . Tobacco comment:  3cigarettes a day  Substance Use Topics  . Alcohol use: No    FAMILY HISTORY:  Family History  Adopted: Yes  Problem Relation Age of Onset  . Breast cancer Neg Hx   . Ovarian cancer Neg Hx   . Colon cancer Neg Hx     DRUG ALLERGIES:  Allergies  Allergen Reactions  . Other Other (See Comments)    Maple tree, dust, and rag weed  . Pollen Extract Other (See Comments)  . Latex Rash    Breaks out    REVIEW OF SYSTEMS:   RESPIRATORY: No cough, shortness of breath, wheezing.  CARDIOVASCULAR: No chest pain, orthopnea, edema.  HEMATOLOGY: No anemia, easy bruising or bleeding SKIN: No rash or lesion. NEUROLOGIC: No tingling, numbness, weakness.  PSYCHIATRY: No anxiety or depression.   MEDICATIONS AT HOME:  Prior to Admission medications   Medication Sig Start Date End Date Taking? Authorizing Provider  albuterol  (PROVENTIL HFA;VENTOLIN HFA) 108 (90 Base) MCG/ACT inhaler Inhale 2 puffs into the lungs.    [provider]  EPINEPHrine 0.3 mg/0.3 mL IJ SOAJ injection Inject into the muscle.    [provider]  neomycin-polymyxin-hydrocortisone (CORTISPORIN) 3.5-10000-1 OTIC suspension Place 4 drops into both ears 3 (three) times daily. For seven days. Started on 11/29/19    [provider]  ondansetron (ZOFRAN-ODT) 8 MG disintegrating tablet Take 8 mg by mouth every 8 (eight) hours as needed for nausea or vomiting.    [provider]  Prenatal Vit-Fe Fumarate-FA (PRENATAL MULTIVITAMIN) TABS tablet Take 1 tablet by mouth daily at 12 noon.    [provider]      PHYSICAL EXAMINATION:   VITAL SIGNS: Last menstrual period 03/25/2019.  GENERAL:  29 y.o.-year-old patient no acute distress.  HEENT: Head atraumatic, normocephalic. Oropharynx and nasopharynx clear. MP 2, TM distance >3 cm, normal mouth opening, grade 1 upper lip bite. LUNGS: Normal breath sounds bilaterally, no wheezing, rales,rhonchi. No use of accessory muscles of respiration.  CARDIOVASCULAR: S1, S2 normal. No murmurs, rubs, or gallops.  EXTREMITIES: No pedal edema, cyanosis, or clubbing.  NEUROLOGIC: normal gait PSYCHIATRIC: The patient is alert and oriented x 3.  SKIN: No obvious rash, lesion, or ulcer.    IMPRESSION AND PLAN:   Pamela Benjamin  is a 29 y.o. female presenting with obesity during  pregnancy. BMI is currently 53 at [redacted] weeks gestation.   Airway exam reassuring today. Spinal interspaces minimally palpable.   Current plan for delivery at 37 weeks via cesarean due to breech presentation. Discussed plan for spinal/CSE for planned cesarean delivery.   If fetal position changes plan would be to induce at 37 weeks. We discussed analgesic options during labor including epidural analgesia. Discussed that in obesity there can be increased difficulty with epidural placement or even failure  of successful epidural. We also discussed that even after successful epidural placement there is increased risk of catheter migration out of the epidural space that would require catheter replacement. Discussed use of epidural vs spinal vs GA if cesarean delivery is required. Discussed increased risk of difficult intubation during pregnancy should an emergency cesarean delivery be required.   Plan for delivery at [facility].

## 2019-12-16 ENCOUNTER — Other Ambulatory Visit: Payer: Medicaid Other

## 2019-12-16 ENCOUNTER — Ambulatory Visit
Admission: RE | Admit: 2019-12-16 | Discharge: 2019-12-16 | Disposition: A | Discharge status: Home / Self Care | Payer: Medicaid Other | Source: Ambulatory Visit | Attending: Obstetrics and Gynecology | Admitting: Obstetrics and Gynecology

## 2019-12-16 ENCOUNTER — Other Ambulatory Visit: Payer: Self-pay

## 2019-12-16 DIAGNOSIS — Z3A35 35 weeks gestation of pregnancy: Secondary | ICD-10-CM | POA: Diagnosis not present

## 2019-12-16 DIAGNOSIS — O163 Unspecified maternal hypertension, third trimester: Secondary | ICD-10-CM | POA: Diagnosis not present

## 2019-12-16 DIAGNOSIS — O365930 Maternal care for other known or suspected poor fetal growth, third trimester, not applicable or unspecified: Secondary | ICD-10-CM | POA: Insufficient documentation

## 2019-12-16 DIAGNOSIS — O321XX0 Maternal care for breech presentation, not applicable or unspecified: Secondary | ICD-10-CM | POA: Insufficient documentation

## 2019-12-16 NOTE — ED Notes (Signed)
On arrival to [facility] pt BP was 140/83 Pulse 85. Recheck was 136/86. Dr. Quin Hoop notified.

## 2019-12-21 ENCOUNTER — Other Ambulatory Visit: Payer: Self-pay | Admitting: Obstetrics and Gynecology

## 2019-12-21 DIAGNOSIS — O365990 Maternal care for other known or suspected poor fetal growth, unspecified trimester, not applicable or unspecified: Secondary | ICD-10-CM

## 2019-12-23 ENCOUNTER — Ambulatory Visit
Admission: RE | Admit: 2019-12-23 | Discharge: 2019-12-23 | Disposition: A | Discharge status: Home / Self Care | Payer: Medicaid Other | Source: Ambulatory Visit | Attending: Maternal & Fetal Medicine | Admitting: Maternal & Fetal Medicine

## 2019-12-23 ENCOUNTER — Ambulatory Visit
Admission: RE | Admit: 2019-12-23 | Discharge: 2019-12-23 | Disposition: A | Discharge status: Home / Self Care | Payer: Medicaid Other | Source: Ambulatory Visit | Attending: *Deleted | Admitting: *Deleted

## 2019-12-23 ENCOUNTER — Ambulatory Visit
Admission: RE | Admit: 2019-12-23 | Discharge: 2019-12-23 | Disposition: A | Discharge status: Home / Self Care | Payer: Medicaid Other | Source: Ambulatory Visit

## 2019-12-23 ENCOUNTER — Other Ambulatory Visit: Payer: Self-pay

## 2019-12-23 DIAGNOSIS — R001 Bradycardia, unspecified: Secondary | ICD-10-CM | POA: Diagnosis not present

## 2019-12-23 DIAGNOSIS — O133 Gestational [pregnancy-induced] hypertension without significant proteinuria, third trimester: Secondary | ICD-10-CM | POA: Diagnosis not present

## 2019-12-23 DIAGNOSIS — G4733 Obstructive sleep apnea (adult) (pediatric): Secondary | ICD-10-CM | POA: Diagnosis not present

## 2019-12-23 DIAGNOSIS — O99353 Diseases of the nervous system complicating pregnancy, third trimester: Secondary | ICD-10-CM | POA: Insufficient documentation

## 2019-12-23 DIAGNOSIS — Z3A36 36 weeks gestation of pregnancy: Secondary | ICD-10-CM | POA: Diagnosis not present

## 2019-12-23 DIAGNOSIS — O365930 Maternal care for other known or suspected poor fetal growth, third trimester, not applicable or unspecified: Secondary | ICD-10-CM | POA: Insufficient documentation

## 2019-12-23 DIAGNOSIS — O99413 Diseases of the circulatory system complicating pregnancy, third trimester: Secondary | ICD-10-CM | POA: Diagnosis not present

## 2019-12-23 DIAGNOSIS — N912 Amenorrhea, unspecified: Secondary | ICD-10-CM

## 2019-12-23 DIAGNOSIS — O365990 Maternal care for other known or suspected poor fetal growth, unspecified trimester, not applicable or unspecified: Secondary | ICD-10-CM

## 2019-12-23 DIAGNOSIS — O10013 Pre-existing essential hypertension complicating pregnancy, third trimester: Secondary | ICD-10-CM

## 2019-12-23 DIAGNOSIS — R9431 Abnormal electrocardiogram [ECG] [EKG]: Secondary | ICD-10-CM | POA: Insufficient documentation

## 2019-12-23 NOTE — Progress Notes (Signed)
Pt's BP 142/84 on arrival to [facility].  Dr. Dolphus Jenny notified.

## 2019-12-23 NOTE — Progress Notes (Addendum)
[place] Follow up Consultation    HPI: Pamela Benjamin is a 29 y.o. G1P0000 at 76w[facility]dby [facility] who presents for follow up due to histoyr of chronic hypertension (no meds), FGR, BMI 51, OSA and fetal malpresentation.  She has no c/o headaches, visual sxs or ruq/epigastric pain.   Follow up growth ultrasound today EFW 2157g (less than 3rd percentile), BREECH, normal amniotic fluid and dipplers.  Please see that report for details.  NST 130s reactive, moderate variability No ctx  Physical Exam: BP 143/84  pc ratio 12/09/19 0.08 Lab Results  Component Value Date   WBC 11.5 (H) 11/15/2019   HGB 12.1 11/15/2019   HCT 35.6 (L) 11/15/2019   MCV 92.2 11/15/2019   PLT 275 11/15/2019   Lab Results  Component Value Date   ALT 25 11/15/2019   AST 19 11/15/2019   ALKPHOS 73 11/15/2019   BILITOT 0.6 11/15/2019   Lab Results  Component Value Date   CREATININE 0.47 11/15/2019    Assessement/Plan: BMI 54 --she has met with anesthesiology --we addressed DVT risks (due to BMI and cs) and use of SCDs during surgery, and early ambulation postpartum as well as use of thromboprophylaxis postpartum --we would recommend prophylactic lovenox for 4-6 weeks postpartum beginning 12 hours after uncomplicated cesarean delivery; if bleeding is excessive, therapy can begin 24 hours postpartum (uncless contraindicated by condition such as ongoing bleeding is present) --we generally recommend 3g of Cefazolin pre op rather than 2g  cHTN BP in mild range with no medication.   She is s/p admission and steroid administration earlier in pregnancy due to BP elevation --I ordered a baseline ECG today.  Agree with plan for 37 week delivery.   Isolated FGR at less than 3rd percentile, normal umbilical artery dopplers Recommend delivery at [redacted] weeks gestation.   Total time spent with the patient was 20 minutes with greater than 50% spent in  counseling and coordination of care. We appreciate this interesting consult and will be happy to be involved in the ongoing care of [name] in anyway her obstetricians desire.  MManfred Shirts MD [facility] Addendum: ECG prelim read sinus brady cardia and ?septal infarct I spoke with Dr. SOuida Sillsabout ECG read-- and pt will have cardiology evaluation/interpretation.

## 2019-12-24 LAB — OB RESULTS CONSOLE GBS: GBS: NEGATIVE

## 2019-12-25 ENCOUNTER — Other Ambulatory Visit: Payer: Self-pay | Admitting: Obstetrics & Gynecology

## 2019-12-25 NOTE — Progress Notes (Signed)
1. Morbid obesity, BMI 55 - has met w/ anesthesia and ok'd for delivery @ [place]. 2. Sleep Apnea  3. IUGR - ultrasound yesterday showed <3%ile 4. Amniotic band syndrome - no synechiae on last ultrasound 5. chronic HTN - worsening in 2nd half of pregnancy.  No meds.  S/p BMZ  6. Breech   Was seen by MFM yesterday, 1/28 for growth ultrasound, dopplers, and BPP -EFW 2157g (4lb 12oz) <3rd %ile -UA dopplers WNL -AFI: 14cm 51%ile -BP 143/84 -NST 130, reactive  EKG performed due to cHTN, showed sinus bradycardia and ?septal infarct - to be read/interpreted by cardiology, and may need cardiac clearance prior to cesarean delivery.  Recommend DVT ppx post delivery, and 4-6wks after discharge.   Prenatal Labs: Blood type/Rh O pos  Antibody screen neg  Rubella Immune  Varicella Immune  RPR NR  HBsAg Neg  HIV NR  GC neg  Chlamydia neg  Genetic screening negative  1 hour GTT 130  3 hour GTT --  GBS pending   Tdap: given  Flu: given  COVID: needed 2/1 Breast Abstinence

## 2019-12-27 ENCOUNTER — Other Ambulatory Visit
Admission: RE | Admit: 2019-12-27 | Discharge: 2019-12-27 | Disposition: A | Discharge status: Home / Self Care | Payer: Medicaid Other | Source: Ambulatory Visit | Attending: Obstetrics & Gynecology | Admitting: Obstetrics & Gynecology

## 2019-12-27 DIAGNOSIS — Z20822 Contact with and (suspected) exposure to covid-19: Secondary | ICD-10-CM | POA: Diagnosis not present

## 2019-12-27 DIAGNOSIS — Z01812 Encounter for preprocedural laboratory examination: Secondary | ICD-10-CM | POA: Diagnosis present

## 2019-12-28 LAB — SARS CORONAVIRUS 2 (TAT 6-24 HRS): SARS Coronavirus 2: NEGATIVE

## 2019-12-29 ENCOUNTER — Inpatient Hospital Stay: Payer: Medicaid Other

## 2019-12-29 ENCOUNTER — Inpatient Hospital Stay
Admission: RE | Admit: 2019-12-29 | Discharge: 2019-12-31 | DRG: 787 | Disposition: A | Discharge status: Home / Self Care | Payer: Medicaid Other | Attending: Obstetrics & Gynecology | Admitting: Obstetrics & Gynecology

## 2019-12-29 ENCOUNTER — Encounter: Payer: Self-pay | Admitting: Obstetrics & Gynecology

## 2019-12-29 ENCOUNTER — Other Ambulatory Visit: Payer: Self-pay

## 2019-12-29 ENCOUNTER — Encounter
Admission: RE | Disposition: A | Discharge status: Home / Self Care | Payer: Self-pay | Source: Home / Self Care | Attending: Obstetrics & Gynecology

## 2019-12-29 DIAGNOSIS — O365990 Maternal care for other known or suspected poor fetal growth, unspecified trimester, not applicable or unspecified: Secondary | ICD-10-CM | POA: Diagnosis present

## 2019-12-29 DIAGNOSIS — O321XX0 Maternal care for breech presentation, not applicable or unspecified: Principal | ICD-10-CM | POA: Diagnosis present

## 2019-12-29 DIAGNOSIS — F17210 Nicotine dependence, cigarettes, uncomplicated: Secondary | ICD-10-CM | POA: Diagnosis present

## 2019-12-29 DIAGNOSIS — O9952 Diseases of the respiratory system complicating childbirth: Secondary | ICD-10-CM | POA: Diagnosis present

## 2019-12-29 DIAGNOSIS — Z3A37 37 weeks gestation of pregnancy: Secondary | ICD-10-CM | POA: Diagnosis not present

## 2019-12-29 DIAGNOSIS — J45909 Unspecified asthma, uncomplicated: Secondary | ICD-10-CM | POA: Diagnosis present

## 2019-12-29 DIAGNOSIS — Q513 Bicornate uterus: Secondary | ICD-10-CM

## 2019-12-29 DIAGNOSIS — O1002 Pre-existing essential hypertension complicating childbirth: Secondary | ICD-10-CM | POA: Diagnosis present

## 2019-12-29 DIAGNOSIS — O365930 Maternal care for other known or suspected poor fetal growth, third trimester, not applicable or unspecified: Secondary | ICD-10-CM | POA: Diagnosis present

## 2019-12-29 DIAGNOSIS — O99214 Obesity complicating childbirth: Secondary | ICD-10-CM | POA: Diagnosis present

## 2019-12-29 DIAGNOSIS — O99334 Smoking (tobacco) complicating childbirth: Secondary | ICD-10-CM | POA: Diagnosis present

## 2019-12-29 DIAGNOSIS — Z20822 Contact with and (suspected) exposure to covid-19: Secondary | ICD-10-CM | POA: Diagnosis present

## 2019-12-29 DIAGNOSIS — O3403 Maternal care for unspecified congenital malformation of uterus, third trimester: Secondary | ICD-10-CM | POA: Diagnosis present

## 2019-12-29 LAB — COMPREHENSIVE METABOLIC PANEL
ALT: 18 U/L (ref 0–44)
AST: 15 U/L (ref 15–41)
Albumin: 3.1 g/dL — ABNORMAL LOW (ref 3.5–5.0)
Alkaline Phosphatase: 92 U/L (ref 38–126)
Anion gap: 8 (ref 5–15)
BUN: 12 mg/dL (ref 6–20)
CO2: 21 mmol/L — ABNORMAL LOW (ref 22–32)
Calcium: 9.1 mg/dL (ref 8.9–10.3)
Chloride: 106 mmol/L (ref 98–111)
Creatinine, Ser: 0.64 mg/dL (ref 0.44–1.00)
GFR calc Af Amer: >60 mL/min (ref >60)
GFR calc non Af Amer: >60 mL/min (ref >60)
Glucose, Bld: 78 mg/dL (ref 70–99)
Potassium: 3.8 mmol/L (ref 3.5–5.1)
Sodium: 135 mmol/L (ref 135–145)
Total Bilirubin: 0.3 mg/dL (ref 0.3–1.2)
Total Protein: 6.6 g/dL (ref 6.5–8.1)

## 2019-12-29 LAB — TYPE AND SCREEN
ABO/RH(D): O POS
Antibody Screen: NEGATIVE

## 2019-12-29 LAB — CBC
HCT: 42.1 % (ref 36.0–46.0)
Hemoglobin: 14.7 g/dL (ref 12.0–15.0)
MCH: 32.1 pg (ref 26.0–34.0)
MCHC: 34.9 g/dL (ref 30.0–36.0)
MCV: 91.9 fL (ref 80.0–100.0)
Platelets: 324 10*3/uL (ref 150–400)
RBC: 4.58 MIL/uL (ref 3.87–5.11)
RDW: 12.9 % (ref 11.5–15.5)
WBC: 12.2 10*3/uL — ABNORMAL HIGH (ref 4.0–10.5)
nRBC: 0 % (ref 0.0–0.2)

## 2019-12-29 LAB — RPR: RPR Ser Ql: NONREACTIVE

## 2019-12-29 SURGERY — Surgical Case
Anesthesia: Spinal

## 2019-12-29 MED ORDER — PHENYLEPHRINE HCL (PRESSORS) 10 MG/ML IV SOLN
INTRAVENOUS | Status: AC
  Filled 2019-12-29: qty 1

## 2019-12-29 MED ORDER — SENNOSIDES-DOCUSATE SODIUM 8.6-50 MG PO TABS
2.0000 | ORAL_TABLET | ORAL | Status: DC
  Administered 2019-12-30 – 2019-12-31 (×2): 2 via ORAL
  Filled 2019-12-29 (×2): qty 2

## 2019-12-29 MED ORDER — ONDANSETRON HCL 4 MG/2ML IJ SOLN
INTRAMUSCULAR | Status: DC | PRN
  Administered 2019-12-29: 4 mg via INTRAVENOUS

## 2019-12-29 MED ORDER — LACTATED RINGERS IV SOLN: INTRAVENOUS | Status: DC | PRN

## 2019-12-29 MED ORDER — OXYCODONE HCL 5 MG PO TABS: 5.0000 mg | ORAL_TABLET | ORAL | Status: DC | PRN

## 2019-12-29 MED ORDER — NALOXONE HCL 0.4 MG/ML IJ SOLN: 0.4000 mg | INTRAMUSCULAR | Status: DC | PRN

## 2019-12-29 MED ORDER — SODIUM CHLORIDE 0.9 % IV SOLN
250.0000 mL | INTRAVENOUS | Status: DC
  Administered 2019-12-30: 250 mL via INTRAVENOUS

## 2019-12-29 MED ORDER — EPHEDRINE SULFATE 50 MG/ML IJ SOLN
INTRAMUSCULAR | Status: AC
  Filled 2019-12-29: qty 1

## 2019-12-29 MED ORDER — LACTATED RINGERS IV SOLN: INTRAVENOUS | Status: DC

## 2019-12-29 MED ORDER — DEXTROSE 5 % IV SOLN
3.0000 g | INTRAVENOUS | Status: AC
  Administered 2019-12-29: 09:00:00 3 g via INTRAVENOUS
  Filled 2019-12-29: qty 3000

## 2019-12-29 MED ORDER — LACTATED RINGERS IV BOLUS: 500.0000 mL | Freq: Once | INTRAVENOUS | Status: DC

## 2019-12-29 MED ORDER — SIMETHICONE 80 MG PO CHEW
160.0000 mg | CHEWABLE_TABLET | Freq: Four times a day (QID) | ORAL | Status: DC | PRN
  Administered 2019-12-30 (×3): 160 mg via ORAL
  Filled 2019-12-29 (×3): qty 2

## 2019-12-29 MED ORDER — BUPIVACAINE LIPOSOME 1.3 % IJ SUSP
20.0000 mL | INTRAMUSCULAR | Status: DC
  Filled 2019-12-29: qty 20

## 2019-12-29 MED ORDER — ENOXAPARIN SODIUM 40 MG/0.4ML ~~LOC~~ SOLN
40.0000 mg | SUBCUTANEOUS | Status: DC
  Administered 2019-12-30 – 2019-12-31 (×2): 40 mg via SUBCUTANEOUS
  Filled 2019-12-29 (×3): qty 0.4

## 2019-12-29 MED ORDER — MORPHINE SULFATE (PF) 0.5 MG/ML IJ SOLN
INTRAMUSCULAR | Status: DC | PRN
  Administered 2019-12-29: .1 mg via INTRATHECAL

## 2019-12-29 MED ORDER — ACETAMINOPHEN 650 MG RE SUPP
650.0000 mg | RECTAL | Status: AC
  Administered 2019-12-29: 09:00:00 650 mg via RECTAL
  Filled 2019-12-29 (×2): qty 1

## 2019-12-29 MED ORDER — KETOROLAC TROMETHAMINE 30 MG/ML IJ SOLN
INTRAMUSCULAR | Status: AC
  Filled 2019-12-29: qty 1

## 2019-12-29 MED ORDER — OXYCODONE HCL 5 MG PO TABS: 10.0000 mg | ORAL_TABLET | ORAL | Status: DC | PRN

## 2019-12-29 MED ORDER — BUPIVACAINE HCL (PF) 0.5 % IJ SOLN
INTRAMUSCULAR | Status: DC | PRN
  Administered 2019-12-29: 30 mL

## 2019-12-29 MED ORDER — BUPIVACAINE LIPOSOME 1.3 % IJ SUSP
INTRAMUSCULAR | Status: DC | PRN
  Administered 2019-12-29: 20 mL

## 2019-12-29 MED ORDER — SUCCINYLCHOLINE CHLORIDE 20 MG/ML IJ SOLN
INTRAMUSCULAR | Status: AC
  Filled 2019-12-29: qty 1

## 2019-12-29 MED ORDER — BUPIVACAINE IN DEXTROSE 0.75-8.25 % IT SOLN
INTRATHECAL | Status: DC | PRN
  Administered 2019-12-29: 1.6 mL via INTRATHECAL

## 2019-12-29 MED ORDER — DIBUCAINE (PERIANAL) 1 % EX OINT: 1.0000 "application " | TOPICAL_OINTMENT | CUTANEOUS | Status: DC | PRN

## 2019-12-29 MED ORDER — OXYTOCIN 40 UNITS IN NORMAL SALINE INFUSION - SIMPLE MED: 2.5000 [IU]/h | INTRAVENOUS | Status: AC

## 2019-12-29 MED ORDER — PRENATAL MULTIVITAMIN CH
1.0000 | ORAL_TABLET | Freq: Every day | ORAL | Status: DC
  Administered 2019-12-29 – 2019-12-31 (×3): 1 via ORAL
  Filled 2019-12-29 (×3): qty 1

## 2019-12-29 MED ORDER — MEPERIDINE HCL 25 MG/ML IJ SOLN: 6.2500 mg | INTRAMUSCULAR | Status: DC | PRN

## 2019-12-29 MED ORDER — COCONUT OIL OIL
1.0000 "application " | TOPICAL_OIL | Status: DC | PRN
  Filled 2019-12-29: qty 120

## 2019-12-29 MED ORDER — FENTANYL CITRATE (PF) 100 MCG/2ML IJ SOLN
INTRAMUSCULAR | Status: DC | PRN
  Administered 2019-12-29: 10 ug via INTRATHECAL

## 2019-12-29 MED ORDER — EPHEDRINE SULFATE 50 MG/ML IJ SOLN
INTRAMUSCULAR | Status: DC | PRN
  Administered 2019-12-29 (×5): 5 mg via INTRAVENOUS

## 2019-12-29 MED ORDER — DIPHENHYDRAMINE HCL 25 MG PO CAPS: 25.0000 mg | ORAL_CAPSULE | Freq: Four times a day (QID) | ORAL | Status: DC | PRN

## 2019-12-29 MED ORDER — OXYTOCIN 40 UNITS IN NORMAL SALINE INFUSION - SIMPLE MED
INTRAVENOUS | Status: AC
  Administered 2019-12-29: 2.5 [IU]/h via INTRAVENOUS
  Filled 2019-12-29: qty 1000

## 2019-12-29 MED ORDER — SODIUM CHLORIDE 0.9% FLUSH: 3.0000 mL | INTRAVENOUS | Status: DC | PRN

## 2019-12-29 MED ORDER — SODIUM CHLORIDE (PF) 0.9 % IJ SOLN
INTRAMUSCULAR | Status: AC
  Filled 2019-12-29: qty 50

## 2019-12-29 MED ORDER — PHENYLEPHRINE HCL (PRESSORS) 10 MG/ML IV SOLN
INTRAVENOUS | Status: DC | PRN
  Administered 2019-12-29: 100 ug via INTRAVENOUS
  Administered 2019-12-29 (×2): 150 ug via INTRAVENOUS

## 2019-12-29 MED ORDER — LACTATED RINGERS IV BOLUS
500.0000 mL | Freq: Once | INTRAVENOUS | Status: AC
  Administered 2019-12-29: 18:00:00 500 mL via INTRAVENOUS

## 2019-12-29 MED ORDER — OXYTOCIN 40 UNITS IN NORMAL SALINE INFUSION - SIMPLE MED
INTRAVENOUS | Status: AC
  Filled 2019-12-29: qty 1000

## 2019-12-29 MED ORDER — BUPIVACAINE HCL (PF) 0.5 % IJ SOLN
30.0000 mL | INTRAMUSCULAR | Status: DC
  Filled 2019-12-29: qty 30

## 2019-12-29 MED ORDER — NALBUPHINE HCL 10 MG/ML IJ SOLN: 5.0000 mg | Freq: Once | INTRAMUSCULAR | Status: DC | PRN

## 2019-12-29 MED ORDER — ACETAMINOPHEN 500 MG PO TABS: 1000.0000 mg | ORAL_TABLET | Freq: Four times a day (QID) | ORAL | Status: DC

## 2019-12-29 MED ORDER — OXYTOCIN 10 UNIT/ML IJ SOLN
INTRAMUSCULAR | Status: AC
  Filled 2019-12-29: qty 4

## 2019-12-29 MED ORDER — KETOROLAC TROMETHAMINE 30 MG/ML IJ SOLN
INTRAMUSCULAR | Status: DC | PRN
  Administered 2019-12-29: 30 mg via INTRAVENOUS

## 2019-12-29 MED ORDER — NALBUPHINE HCL 10 MG/ML IJ SOLN: 5.0000 mg | INTRAMUSCULAR | Status: DC | PRN

## 2019-12-29 MED ORDER — ONDANSETRON HCL 4 MG/2ML IJ SOLN
INTRAMUSCULAR | Status: AC
  Filled 2019-12-29: qty 2

## 2019-12-29 MED ORDER — SEVOFLURANE IN SOLN
RESPIRATORY_TRACT | Status: AC
  Filled 2019-12-29: qty 250

## 2019-12-29 MED ORDER — SODIUM CHLORIDE 0.9 % IR SOLN
Status: DC | PRN
  Administered 2019-12-29: 50 mL

## 2019-12-29 MED ORDER — ONDANSETRON HCL 4 MG/2ML IJ SOLN: 4.0000 mg | Freq: Three times a day (TID) | INTRAMUSCULAR | Status: DC | PRN

## 2019-12-29 MED ORDER — SODIUM CHLORIDE 0.9 % IV SOLN
INTRAVENOUS | Status: DC | PRN
  Administered 2019-12-29: 50 ug/min via INTRAVENOUS

## 2019-12-29 MED ORDER — SOD CITRATE-CITRIC ACID 500-334 MG/5ML PO SOLN
30.0000 mL | ORAL | Status: AC
  Administered 2019-12-29: 30 mL via ORAL
  Filled 2019-12-29: qty 30

## 2019-12-29 MED ORDER — PHENYLEPHRINE 40 MCG/ML (10ML) SYRINGE FOR IV PUSH (FOR BLOOD PRESSURE SUPPORT): PREFILLED_SYRINGE | INTRAVENOUS | Status: DC | PRN

## 2019-12-29 MED ORDER — NALOXONE HCL 4 MG/10ML IJ SOLN
1.0000 ug/kg/h | INTRAVENOUS | Status: DC | PRN
  Filled 2019-12-29: qty 5

## 2019-12-29 MED ORDER — FENTANYL CITRATE (PF) 100 MCG/2ML IJ SOLN
INTRAMUSCULAR | Status: AC
  Filled 2019-12-29: qty 2

## 2019-12-29 MED ORDER — OXYTOCIN 40 UNITS IN NORMAL SALINE INFUSION - SIMPLE MED
INTRAVENOUS | Status: DC | PRN
  Administered 2019-12-29 (×2): 1000 mL via INTRAVENOUS

## 2019-12-29 MED ORDER — SODIUM CHLORIDE 0.9% FLUSH
3.0000 mL | Freq: Two times a day (BID) | INTRAVENOUS | Status: DC
  Administered 2019-12-29: 3 mL via INTRAVENOUS

## 2019-12-29 MED ORDER — IBUPROFEN 600 MG PO TABS
600.0000 mg | ORAL_TABLET | Freq: Four times a day (QID) | ORAL | Status: DC
  Administered 2019-12-29 – 2019-12-31 (×7): 600 mg via ORAL
  Filled 2019-12-29 (×7): qty 1

## 2019-12-29 MED ORDER — SCOPOLAMINE 1 MG/3DAYS TD PT72
1.0000 | MEDICATED_PATCH | Freq: Once | TRANSDERMAL | Status: DC
  Administered 2019-12-29: 1.5 mg via TRANSDERMAL
  Filled 2019-12-29: qty 1

## 2019-12-29 MED ORDER — WITCH HAZEL-GLYCERIN EX PADS: 1.0000 "application " | MEDICATED_PAD | CUTANEOUS | Status: DC | PRN

## 2019-12-29 MED ORDER — PROPOFOL 10 MG/ML IV BOLUS
INTRAVENOUS | Status: AC
  Filled 2019-12-29: qty 20

## 2019-12-29 MED ORDER — MORPHINE SULFATE (PF) 0.5 MG/ML IJ SOLN
INTRAMUSCULAR | Status: AC
  Filled 2019-12-29: qty 10

## 2019-12-29 MED ORDER — ACETAMINOPHEN 500 MG PO TABS
1000.0000 mg | ORAL_TABLET | Freq: Four times a day (QID) | ORAL | Status: AC
  Administered 2019-12-29 – 2019-12-30 (×4): 1000 mg via ORAL
  Filled 2019-12-29 (×4): qty 2

## 2019-12-29 MED ORDER — DEXAMETHASONE SODIUM PHOSPHATE 10 MG/ML IJ SOLN
INTRAMUSCULAR | Status: AC
  Filled 2019-12-29: qty 1

## 2019-12-29 MED ORDER — MENTHOL 3 MG MT LOZG
1.0000 | LOZENGE | OROMUCOSAL | Status: DC | PRN
  Filled 2019-12-29: qty 9

## 2019-12-29 MED ORDER — DEXAMETHASONE SODIUM PHOSPHATE 10 MG/ML IJ SOLN
INTRAMUSCULAR | Status: DC | PRN
  Administered 2019-12-29: 10 mg via INTRAVENOUS

## 2019-12-29 SURGICAL SUPPLY — 43 items
BINDER ABD UNIV 9 30-45 (GAUZE/BANDAGES/DRESSINGS) ×1 IMPLANT
BINDER ABDOMINAL 12 ML 46-62 (SOFTGOODS) ×6 IMPLANT
BINDER ABDOMINAL 9 (GAUZE/BANDAGES/DRESSINGS) ×3
CANISTER SUCT 3000ML PPV (MISCELLANEOUS) ×3 IMPLANT
CLOSURE WOUND 1/2 X4 (GAUZE/BANDAGES/DRESSINGS) ×1
COVER WAND RF STERILE (DRAPES) ×3 IMPLANT
DECANTER SPIKE VIAL GLASS SM (MISCELLANEOUS) ×3 IMPLANT
DERMABOND ADHESIVE PROPEN (GAUZE/BANDAGES/DRESSINGS) ×2
DERMABOND ADVANCED (GAUZE/BANDAGES/DRESSINGS)
DERMABOND ADVANCED .7 DNX12 (GAUZE/BANDAGES/DRESSINGS) IMPLANT
DERMABOND ADVANCED .7 DNX6 (GAUZE/BANDAGES/DRESSINGS) ×1 IMPLANT
DRSG TELFA 3X8 NADH (GAUZE/BANDAGES/DRESSINGS) ×3 IMPLANT
ELECT CAUTERY BLADE 6.4 (BLADE) ×3 IMPLANT
ELECT REM PT RETURN 9FT ADLT (ELECTROSURGICAL) ×6
ELECTRODE REM PT RTRN 9FT ADLT (ELECTROSURGICAL) ×2 IMPLANT
GAUZE SPONGE 4X4 12PLY STRL (GAUZE/BANDAGES/DRESSINGS) ×3 IMPLANT
GLOVE PI ORTHOPRO 6.5 (GLOVE) ×2
GLOVE PI ORTHOPRO STRL 6.5 (GLOVE) ×1 IMPLANT
GLOVE SURG SYN 6.5 ES PF (GLOVE) ×3 IMPLANT
GOWN STRL REUS W/ TWL LRG LVL3 (GOWN DISPOSABLE) ×3 IMPLANT
GOWN STRL REUS W/TWL LRG LVL3 (GOWN DISPOSABLE) ×6
KIT PREVENA INCISION MGT 13 (CANNISTER) ×3 IMPLANT
KIT PREVENA INCISION MGT20CM45 (CANNISTER) ×3 IMPLANT
NS IRRIG 1000ML POUR BTL (IV SOLUTION) ×3 IMPLANT
PACK C SECTION AR (MISCELLANEOUS) ×3 IMPLANT
PAD OB MATERNITY 4.3X12.25 (PERSONAL CARE ITEMS) ×3 IMPLANT
PAD PREP 24X41 OB/GYN DISP (PERSONAL CARE ITEMS) ×3 IMPLANT
PENCIL SMOKE ULTRAEVAC 22 CON (MISCELLANEOUS) ×3 IMPLANT
RETRACTOR TRAXI PANNICULUS (MISCELLANEOUS) ×1 IMPLANT
STAPLER INSORB 30 2030 C-SECTI (MISCELLANEOUS) ×3 IMPLANT
STRAP SAFETY 5IN WIDE (MISCELLANEOUS) ×3 IMPLANT
STRIP CLOSURE SKIN 1/2X4 (GAUZE/BANDAGES/DRESSINGS) ×2 IMPLANT
SUT MNCRL 4-0 (SUTURE) ×2
SUT MNCRL 4-0 27XMFL (SUTURE) ×1
SUT PDS AB 1 TP1 96 (SUTURE) ×6 IMPLANT
SUT VIC AB 0 CT1 36 (SUTURE) ×6 IMPLANT
SUT VIC AB 2-0 CT1 27 (SUTURE) ×2
SUT VIC AB 2-0 CT1 TAPERPNT 27 (SUTURE) ×1 IMPLANT
SUT VIC AB 3-0 SH 27 (SUTURE) ×2
SUT VIC AB 3-0 SH 27X BRD (SUTURE) ×1 IMPLANT
SUTURE MNCRL 4-0 27XMF (SUTURE) ×1 IMPLANT
SWABSTK COMLB BENZOIN TINCTURE (MISCELLANEOUS) ×3 IMPLANT
TRAXI PANNICULUS RETRACTOR (MISCELLANEOUS) ×2

## 2019-12-29 NOTE — Anesthesia Procedure Notes (Signed)
Spinal  Patient location during procedure: OB Start time: 12/29/2019 8:40 AM End time: 12/29/2019 8:52 AM Staffing Performed: anesthesiologist  Anesthesiologist: Arita Miss, MD Preanesthetic Checklist Completed: patient identified, IV checked, site marked, risks and benefits discussed, surgical consent, monitors and equipment checked, pre-op evaluation and timeout performed Spinal Block Patient position: sitting Prep: ChloraPrep Patient monitoring: heart rate, continuous pulse ox, blood pressure and cardiac monitor Approach: midline Location: L3-4 Injection technique: single-shot Needle Needle type: [place]acre and Introducer  Needle gauge: 24 G Needle length: 9 cm Assessment Sensory level: T10 Additional Notes Negative paresthesia. Negative blood return. Positive free-flowing CSF. Expiration date of kit checked and confirmed. Patient tolerated procedure well, without complications.

## 2019-12-29 NOTE — Op Note (Signed)
Cesarean Section Procedure Note  12/29/2019   Patient:  Pamela Benjamin  29 y.o. female at [redacted]w[redacted]d.  Patient's last menstrual period was 03/25/2019 (exact date).  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  PROCEDURE:  Procedure(s): CESAREAN SECTION (N/A) Surgeon:  Surgeon(s) and Role:    * Gorje Iyer, Honor Loh, MD - Primary Hassan Buckler CNM - assisting  Anesthesia:  spinal I/O: Total I/O In: 1600 [I.V.:1600] Out: 161 [Urine:45; Blood:850] Specimens:  Cord Blood, placenta Complications: None Apparent Disposition:  VS stable to PACU  Findings:  Exceptionally vascularized subcutaneous tissues. Bicornuate uterus, with prominent septum to mid-endoemtrial cavity.  Normal tubes and ovaries bilaterally Small placenta, long cord, normally coiled, with tortuous umbilical arteries. Live born female "Pamela Benjamin" Nuchal cord, loose x1 Birth Weight: 4 lb 8 oz (2040 g) APGAR: 8, 9  Newborn Delivery   Birth date/time: 12/29/2019 09:47:00 Delivery type: C-Section, Low Transverse Trial of labor: No C-section categorization: Primary     Indication for procedure: 29 y.o. female G1P0, at [redacted]w[redacted]d with history of chronic hypertension, morbid obesity, intrauterine growth restriction, and breech presentation.  Procedure Details   The risks, benefits, complications, treatment options, and expected outcomes were discussed with the patient. Informed consent was obtained. The patient was taken to Operating Room, identified as JASMEN EMRICH and the procedure verified as a cesarean delivery.   After administration of anesthesia, the patient was prepped and draped in the usual sterile manner, including a vaginal prep. A surgical time out was performed, with the pediatric team present. After confirming adequate  anesthesia, a Pfannenstiel incision was made and carried down through the subcutaneous tissue to the fascia. Bleeding points and vessels were cauterized.  Fascial incision was made and extended transversely. The fascia was separated from the underlying rectus tissue superiorly and inferiorly. The rectus muscles were divided in the midline. The peritoneum was identified and entered. Peritoneal incision was extended longitudinally. A bladder flap was made.  A low transverse uterine incision was made. Delivered from frank breech presentation with standard maneuvers was a live born female.  Delayed cord clamping was performed for 60 seconds while we sang happy birthday to baby Pamela Benjamin. The umbilical cord was doubly clamped and cut, and the baby was handed off to the awaitng pediatrician.  Cord blood was obtained for evaluation. The placenta was removed intact and appeared normal. The uterus was delivered from the abdominal cavity, identified as bicornuate, and cleared of clots, membranes, and debris. The tubes and ovaries appeared normal. The uterine incision was closed with running locking sutures of 0-Vicryl, and then a second, imbricating stitch was placed. Hemostasis was observed. The abdominal cavity was evacuated of extraneous fluid. The uterus was returned to the abdominal cavity and again the incision was inspected for hemostasis, which was confirmed.  The paracolic gutters were cleaned. The fascia was then reapproximated with 2 running sutures of looped PDS from each apex. They were tied together at the midline. 90cc of Long- and short-acting bupivicaine was injected circumferentially into the fascia.  After a change of gloves, the subcutaneous tissue was irrigated and reapproximated with 3-0 vicryl and cat gut. The skin was closed with absorbable staples, and 10cc of long- and short-acting bupivacaine injected into the skin and subcutaneous tissues.  The incision was covered with a negative pressure wound  vacuum.  An abdominal binder was placed.    Instrument,  sponge, and needle counts were correct prior the abdominal closure and at the conclusion of the case.   I was present and performed this procedure in its entirety.  VTE: SCDs Perioperative antibiotics: Ancef 3g  ----- Ranae Plumber, MD Attending Obstetrician and Gynecologist [facility], Department of OB/GYN [facility]

## 2019-12-29 NOTE — Transfer of Care (Signed)
Immediate Anesthesia Transfer of Care Note  Patient: Pamela Benjamin  Procedure(s) Performed: CESAREAN SECTION (N/A )  Patient Location: PACU  Anesthesia Type:Spinal  Level of Consciousness: awake and patient cooperative  Airway & Oxygen Therapy: Patient Spontanous Breathing  Post-op Assessment: Report given to RN and Post -op Vital signs reviewed and stable  Post vital signs: Reviewed and stable  Last Vitals:  Vitals Value Taken Time  BP 132/70   Temp 35.6   Pulse 61   Resp 17   SpO2 100%     Last Pain:  Vitals:   12/29/19 1121  TempSrc: Axillary  PainSc:          Complications: No apparent anesthesia complications

## 2019-12-29 NOTE — Anesthesia Preprocedure Evaluation (Signed)
Anesthesia Evaluation  Patient identified by MRN, date of birth, ID band Patient awake    Reviewed: Allergy & Precautions, NPO status , Patient's Chart, lab work & pertinent test results  History of Anesthesia Complications Negative for: history of anesthetic complications  Airway Mallampati: II  TM Distance: >3 FB Neck ROM: Full    Dental no notable dental hx. (+) Teeth Intact   Pulmonary asthma , sleep apnea , neg COPD, Current SmokerPatient did not abstain from smoking.,  Asthma - never hospitalized, inhalers few times a month. OSA - self d/c'd CPAP   Pulmonary exam normal breath sounds clear to auscultation       Cardiovascular Exercise Tolerance: Good METShypertension, Pt. on medications (-) CAD and (-) Past MI (-) dysrhythmias  Rhythm:Regular Rate:Normal - Systolic murmurs    Neuro/Psych  Headaches, negative psych ROS   GI/Hepatic neg GERD  ,(+)     (-) substance abuse  ,   Endo/Other  neg diabetesMorbid obesity  Renal/GU negative Renal ROS     Musculoskeletal   Abdominal (+) + obese,   Peds  Hematology   Anesthesia Other Findings Past Medical History: No date: ADHD (attention deficit hyperactivity disorder) No date: Asthma No date: Hypertension     Comment:  Propanolol 2 pills bid, pt unaware of dose No date: Mood disorder (HCC) No date: Sleep apnea  Reproductive/Obstetrics                             Anesthesia Physical Anesthesia Plan  ASA: III  Anesthesia Plan: Spinal   Post-op Pain Management:    Induction:   PONV Risk Score and Plan: 3 and Ondansetron and Dexamethasone  Airway Management Planned: Natural Airway  Additional Equipment:   Intra-op Plan:   Post-operative Plan:   Informed Consent: I have reviewed the patients History and Physical, chart, labs and discussed the procedure including the risks, benefits and alternatives for the proposed anesthesia  with the patient or authorized representative who has indicated his/her understanding and acceptance.       Plan Discussed with: CRNA and Surgeon  Anesthesia Plan Comments: (Discussed R/B/A of neuraxial anesthesia technique with patient: - rare risks of spinal/epidural hematoma, nerve damage, infection - Risk of PDPH - Risk of itching - Risk of nausea and vomiting - Risk of surgical bleeding requiring blood products - due to increased BMI, risk of inability to place spinal, and thus need for GETA.  Patient informed about increased incidence of above perioperative risk due to high BMI. Patient understands.  Patient voiced understanding.)        Anesthesia Quick Evaluation

## 2019-12-29 NOTE — Discharge Instructions (Signed)
  Discharge Instructions:    Follow-up Appointment: Schedule your postpartum follow-up appointment ASAP for a visit in 6 weeks!    If there are any new medications, they have been ordered and will be available for pickup at the listed pharmacy on your way home from the hospital. Take 600mg  Ibuprofen and 1000mg  Tylenol together around the clock, every 6 hours for at least the first 3-5 days.  After this you can take as needed.  This will help decrease inflammation and promote healing.  The narcotics you'll take just as needed, as they just trick your brain into thinking its not in pain.    You have a 'wound vac' over your incision.  You will follow up next Wednesday with Dr. to have this removed.     Call office if you have any of the following: headache, visual changes, fever >101.0 F, chills, shortness of breath, breast concerns, excessive vaginal bleeding, incision drainage or problems, leg pain or redness, depression or any other concerns. If you have vaginal discharge with an odor, let your doctor know.    It is normal to bleed for up to 6 weeks. You should not soak through more than 1 pad in 1 hour. If you have a blood clot larger than your fist with continued bleeding, call your doctor.    Activity: Do not lift > 10 lbs for 6 weeks (do not lift anything heavier than your baby). No intercourse, tampons, swimming pools, hot tubs, baths (only showers) for 6 weeks.  No driving for 1-2 weeks. Continue prenatal vitamin, especially if breastfeeding. Increase calories and fluids (water) while breastfeeding.    Your milk will come in, in the next couple of days (right now it is colostrum). You may have a slight fever when your milk comes in, but it should go away on its own.  If it does not, and rises above 101 F please call the doctor. You will also feel achy and your breasts will be firm. They will also start to leak. If you are breastfeeding, continue as you have been and you can  pump/express milk for comfort.    If you have too much milk, your breasts can become engorged, which could lead to mastitis. This is an infection of the milk ducts. It can be very painful and you will need to notify your doctor to obtain a prescription for antibiotics. You can also treat it with a shower or hot/cold compress.    For concerns about your baby, please call your pediatrician.  For breastfeeding concerns, the lactation consultant can be reached at [phone].    Postpartum blues (feelings of happy one minute and sad another minute) are normal for the first few weeks but if it gets worse let your doctor know.    Congratulations! We enjoyed caring for you and your new bundle of joy!

## 2019-12-29 NOTE — Anesthesia Procedure Notes (Cosign Needed Addendum)
Procedure Name: MAC Date/Time: 12/29/2019 8:52 AM Performed by: Nolon Lennert, RN Pre-anesthesia Checklist: Emergency Drugs available, Suction available, Patient identified, Timeout performed and Patient being monitored Patient Re-evaluated:Patient Re-evaluated prior to induction Oxygen Delivery Method: Nasal cannula

## 2019-12-29 NOTE — H&P (Addendum)
OB History & Physical   History of Present Illness:  Chief Complaint:   HPI:  Pamela Benjamin is a 29 y.o. G75P0000 female at [redacted]w[redacted]d dated by 14 week ultrasound. Patient's last menstrual period was 03/25/2019 (exact date). Estimated Date of Delivery: 01/17/20   She presents to L&D for her scheduled cesarean delivery.  +FM, no CTX, no LOF, no VB  Pregnancy Issues: 1. Morbid obesity 2. Chronic hypertension - no meds 3. Amniotic band 4. Fetal growth restriction - last measure <3%ile 5. Breech    Maternal Medical History:   Past Medical History:  Diagnosis Date  . ADHD (attention deficit hyperactivity disorder)   . Asthma   . Hypertension    Propanolol 2 pills bid, pt unaware of dose  . Mood disorder ([place])   . Sleep apnea     Past Surgical History:  Procedure Laterality Date  . NO PAST SURGERIES      Allergies  Allergen Reactions  . Other Other (See Comments)    Maple tree, dust, and rag weed  . Pollen Extract Other (See Comments)  . Latex Rash    Breaks out    Prior to Admission medications   Medication Sig Start Date End Date Taking? Authorizing Provider  Prenatal Vit-Fe Fumarate-FA (PRENATAL MULTIVITAMIN) TABS tablet Take 1 tablet by mouth daily at 12 noon.   Yes [provider]  albuterol (PROVENTIL HFA;VENTOLIN HFA) 108 (90 Base) MCG/ACT inhaler Inhale 2 puffs into the lungs.    [provider]  EPINEPHrine 0.3 mg/0.3 mL IJ SOAJ injection Inject into the muscle.    [provider]     Prenatal care site: [place] History: She  reports that she has been smoking cigarettes. She started smoking about 13 years ago. She has a 12.00 pack-year smoking history. She has never used smokeless tobacco. She reports that she does not drink alcohol or use drugs.  Family History: family history is not on file. She was adopted.   Review of Systems: A full review of systems was performed and negative except as noted in the  HPI.     Physical Exam:  Vital Signs: BP 131/73   Pulse 67   Temp 98 F (36.7 C)   Resp 14   Ht 5\' 4"  (1.626 m)   Wt (!) 144.3 kg   LMP 03/25/2019 (Exact Date)   SpO2 99%   BMI 54.61 kg/m  General: no acute distress.  HEENT: normocephalic, atraumatic Heart: regular rate & rhythm.  No murmurs/rubs/gallops Lungs: clear to auscultation bilaterally, normal respiratory effort Abdomen: soft, gravid, non-tender;  EFW: 5lb Pelvic:   External: Normal external female genitalia   Extremities: non-tender, symmetric, mild edema bilaterally.  DTRs: 2+ Neurologic: Alert & oriented x 3.    Results for orders placed or performed during the hospital encounter of 12/29/19 (from the past 24 hour(s))  CBC     Status: Abnormal   Collection Time: 12/29/19  5:42 AM  Result Value Ref Range   WBC 12.2 (H) 4.0 - 10.5 K/uL   RBC 4.58 3.87 - 5.11 MIL/uL   Hemoglobin 14.7 12.0 - 15.0 g/dL   HCT 42.1 36.0 - 46.0 %   MCV 91.9 80.0 - 100.0 fL   MCH 32.1 26.0 - 34.0 pg   MCHC 34.9 30.0 - 36.0 g/dL   RDW 12.9 11.5 - 15.5 %   Platelets 324 150 - 400 K/uL   nRBC 0.0 0.0 - 0.2 %  Comprehensive  metabolic panel     Status: Abnormal   Collection Time: 12/29/19  5:42 AM  Result Value Ref Range   Sodium 135 135 - 145 mmol/L   Potassium 3.8 3.5 - 5.1 mmol/L   Chloride 106 98 - 111 mmol/L   CO2 21 (L) 22 - 32 mmol/L   Glucose, Bld 78 70 - 99 mg/dL   BUN 12 6 - 20 mg/dL   Creatinine, Ser 0.26 0.44 - 1.00 mg/dL   Calcium 9.1 8.9 - 37.8 mg/dL   Total Protein 6.6 6.5 - 8.1 g/dL   Albumin 3.1 (L) 3.5 - 5.0 g/dL   AST 15 15 - 41 U/L   ALT 18 0 - 44 U/L   Alkaline Phosphatase 92 38 - 126 U/L   Total Bilirubin 0.3 0.3 - 1.2 mg/dL   GFR calc non Af Amer >60 >60 mL/min   GFR calc Af Amer >60 >60 mL/min   Anion gap 8 5 - 15  Type and screen     Status: None   Collection Time: 12/29/19  5:42 AM  Result Value Ref Range   ABO/RH(D) O POS    Antibody Screen NEG    Sample Expiration       01/01/2020,2359 Performed at [facility], [address], [place], [place] [address]     Pertinent Results:  Prenatal Labs: Blood type/Rh O positive  Antibody screen neg  Rubella Immune  Varicella Immune  RPR NR  HBsAg Neg  HIV NR  GC neg  Chlamydia neg  Genetic screening Negative - materniT 21  1 hour GTT 130  3 hour GTT   GBS negative   FHT: 125 mod +accels no decels TOCO: none    Assessment:  Pamela Benjamin is a 29 y.o. G29P0000 female at [redacted]w[redacted]d with scheduled cesarean delivery for IUGR and breech..   Plan:  1. Admit to Labor & Delivery 2. NPO, IVF 3. Labs up to date, COVID negative  4. Consents obtained. 5. Continuous efm/toco until OR 6. To OR when team ready.  ----- Ranae Plumber, MD Attending Obstetrician and Gynecologist [facility], Department of OB/GYN [facility]

## 2019-12-29 NOTE — Discharge Summary (Signed)
Obstetrical Discharge Summary  Patient Name: Pamela Benjamin DOB: 16-Mar-1991 MRN: [id number]  Date of Admission: 12/29/2019 Date of Delivery: 12/29/19 Delivered by: Ranae Plumber, MD Date of Discharge: 12/31/2019  Primary OB: [facility] JSE:[id number]'H last menstrual period was 03/25/2019 (exact date). EDC Estimated Date of Delivery: 01/17/20 Gestational Age at Delivery: [redacted]w[redacted]d   Antepartum complications:  1. Morbid obesity 2. Chronic hypertension - no meds 3. Amniotic band 4. Fetal growth restriction - last measure <3%ile 5. Breech  6. Sleep apnea  Admitting Diagnosis: IUGR, Breech Secondary Diagnosis: Patient Active Problem List   Diagnosis Date Noted  . stressful living situation 12/09/2019  . Poor fetal growth 12/09/2019  . Breech presentation of fetus 12/09/2019  . Gestational hypertension 11/18/2019  . Gestational hypertension w/o significant proteinuria in 3rd trimester 11/12/2019  . Obesity affecting pregnancy 06/25/2019  . Hypertension in pregnancy, antepartum 12/23/2017  . Chronic tension-type headache, not intractable 07/01/2016  . OSA on CPAP 02/02/2015    Augmentation: n/a Complications: None Intrapartum complications/course: routine scheduled cesarean delivery.  See Op Note Delivery Type: primary cesarean section, low transverse incision Anesthesia: spinal Placenta: manual Laceration: n/a Episiotomy: none Newborn Data: Live born female "[name]" Birth Weight: 4 lb 8 oz (2040 g) APGAR: 8, 9  Newborn Delivery   Birth date/time: 12/29/2019 09:47:00 Delivery type: C-Section, Low Transverse Trial of labor: No C-section categorization: Primary      Postpartum Procedures: none  Post partum course:  Patient had an uncomplicated postpartum course.  By time of discharge on POD#2, her pain was controlled on oral pain medications; she had appropriate lochia and was ambulating, voiding without difficulty, tolerating regular diet and passing flatus.   She  was deemed stable for discharge to home.    Discharge Physical Exam:  BP 131/78 (BP Location: Left Arm)   Pulse 85   Temp 97.6 F (36.4 C) (Oral)   Resp 18   Ht 5\' 4"  (1.626 m)   Wt (!) 144.3 kg   LMP 03/25/2019 (Exact Date)   SpO2 97%   Breastfeeding Unknown   BMI 54.61 kg/m   General: NAD CV: RRR Pulm: CTABL, nl effort ABD: s/nd/nt, fundus firm and below the umbilicus Lochia: moderate Incision: neg pressure wound vacuum in place DVT Evaluation: LE non-ttp, no evidence of DVT on exam.  Hemoglobin  Date Value Ref Range Status  12/30/2019 11.1 (L) 12.0 - 15.0 g/dL Final   HCT  Date Value Ref Range Status  12/30/2019 32.6 (L) 36.0 - 46.0 % Final     Disposition: stable, discharge to home. Baby Feeding: breastmilk & formula Baby Disposition: home with mom  Rh Immune globulin given: n/a Rubella vaccine given: n/a Flu vaccine given in AP or PP setting: AP  Contraception: abstinence   Prenatal Labs:   Blood type/Rh O positive  Antibody screen neg  Rubella Immune  Varicella Immune  RPR NR  HBsAg Neg  HIV NR  GC neg  Chlamydia neg  Genetic screening Negative - materniT 21  1 hour GTT 130  3 hour GTT n/a  GBS negative     Plan:  Pamela Benjamin was discharged to home in good condition. Follow-up appointment with Dr. 02/27/2020 in 1 week.  Discharge Medications: Allergies as of 12/31/2019      Reactions   Other Other (See Comments)   Maple tree, dust, and rag weed   Pollen Extract Other (See Comments)   Latex Rash   Breaks out      Medication List  TAKE these medications   acetaminophen 500 MG tablet Commonly known as: TYLENOL Take 2 tablets (1,000 mg total) by mouth every 6 (six) hours as needed for mild pain or moderate pain.   albuterol 108 (90 Base) MCG/ACT inhaler Commonly known as: VENTOLIN HFA Inhale 2 puffs into the lungs.   enoxaparin 40 MG/0.4ML injection Commonly known as: LOVENOX Inject 0.4 mLs (40 mg total) into the skin  daily. Start taking on: January 01, 2020   EPINEPHrine 0.3 mg/0.3 mL Soaj injection Commonly known as: EPI-PEN Inject into the muscle.   ibuprofen 600 MG tablet Commonly known as: ADVIL Take 1 tablet (600 mg total) by mouth every 6 (six) hours as needed for mild pain, moderate pain or cramping.   oxyCODONE 5 MG immediate release tablet Commonly known as: Oxy IR/ROXICODONE Take 1 tablet (5 mg total) by mouth every 6 (six) hours as needed for severe pain.   prenatal multivitamin Tabs tablet Take 1 tablet by mouth daily at 12 noon.       Follow-up Information    Ward, Honor Loh, MD Follow up in 1 week(s).   Specialty: Obstetrics and Gynecology Why: Wednesday, Feb 11 for [facility] removal. Contact information: [place] [address] [phone]           Signed: Lisette Grinder, CNM 12/31/2019 1:30 PM

## 2019-12-29 NOTE — Lactation Note (Signed)
This note was copied from a Pamela's chart. Lactation Consultation Note  Patient Name: Pamela Benjamin SHFWY'O Date: 12/29/2019 Reason for consult: Initial assessment;1st time breastfeeding;Early term 37-38.6wks;Other (Comment)(IUGR)  Lactation called in by Transition RN to assist with moms first breastfeeding after c/s delivery. Pamela Benjamin was breech position, and IUGR. Mom has large and heavy breasts, which mom reported grew during pregnancy. Transition RN had placed pillows under mom's left arm in anticipation of football hold. [employer] [facility] student assisted with sandwich/teacup hold of breast tissue and hand expression to help encourage latch. Pamela was able to latch after a few attempts and sustained latch for 12 minutes. [facility] student provided mom with guidance on positioning of infant at the breast, support of breast tissue due to size and weight, tips for getting Pamela to latch, early hunger cues, newborn stomach size, and wet/stool diaper expectations for first 24 hours, and benefits of skin to skin. Mom does plan to breastfeed and provide formula, currently with a plan of alternating feeds. LC provided education on the impact that formula introduction may have on achieving a plentiful milk supply, and encouraged frequent breast emptying, 8-12x/day especially during the first few weeks to help establish a supply. LC discussed options for continued breast stimulation while providing supplement through hand expression and or use of hand or EBP; mom will consider these options.  Encouraged mom to call for help or with questions.  Maternal Data Formula Feeding for Exclusion: No Has patient been taught Hand Expression?: Yes Does the patient have breastfeeding experience prior to this delivery?: No  Feeding Feeding Type: Breast Fed  LATCH Score Latch: Repeated attempts needed to sustain latch, nipple held in mouth throughout feeding, stimulation needed to elicit sucking reflex.  Audible Swallowing: A few  with stimulation  Type of Nipple: Everted at rest and after stimulation  Comfort (Breast/Nipple): Soft / non-tender  Hold (Positioning): Assistance needed to correctly position infant at breast and maintain latch.  LATCH Score: 7  Interventions Interventions: Breast feeding basics reviewed;Assisted with latch;Skin to skin;Breast massage;Hand express;Support pillows  Lactation Tools Discussed/Used     Consult Status Consult Status: Follow-up Date: 12/29/19 Follow-up type: In-patient    Danford Bad 12/29/2019, 1:53 PM

## 2019-12-30 LAB — BASIC METABOLIC PANEL
Anion gap: 6 (ref 5–15)
BUN: 15 mg/dL (ref 6–20)
CO2: 23 mmol/L (ref 22–32)
Calcium: 9.1 mg/dL (ref 8.9–10.3)
Chloride: 107 mmol/L (ref 98–111)
Creatinine, Ser: 0.68 mg/dL (ref 0.44–1.00)
GFR calc Af Amer: >60 mL/min (ref >60)
GFR calc non Af Amer: >60 mL/min (ref >60)
Glucose, Bld: 95 mg/dL (ref 70–99)
Potassium: 4.2 mmol/L (ref 3.5–5.1)
Sodium: 136 mmol/L (ref 135–145)

## 2019-12-30 LAB — CBC
HCT: 32.6 % — ABNORMAL LOW (ref 36.0–46.0)
Hemoglobin: 11.1 g/dL — ABNORMAL LOW (ref 12.0–15.0)
MCH: 32.2 pg (ref 26.0–34.0)
MCHC: 34 g/dL (ref 30.0–36.0)
MCV: 94.5 fL (ref 80.0–100.0)
Platelets: 249 10*3/uL (ref 150–400)
RBC: 3.45 MIL/uL — ABNORMAL LOW (ref 3.87–5.11)
RDW: 13.1 % (ref 11.5–15.5)
WBC: 18.9 10*3/uL — ABNORMAL HIGH (ref 4.0–10.5)
nRBC: 0 % (ref 0.0–0.2)

## 2019-12-30 NOTE — Lactation Note (Signed)
This note was copied from a baby's chart. Lactation Consultation Note  Patient Name: Girl Fionna Merriott WYSHU'O Date: 12/30/2019 Reason for consult: Follow-up assessment;1st time breastfeeding;Early term 37-38.6wks;Infant < 6lbs  LC in to see mom and baby Naveah. Mom reports primarily formula feeding overnight and this morning due to her being tired. Desires to still try and breastfeed "some", and possibly pump to "see what works and what doesn't". Baby last fed at 11:15, 68mL of formula per mom, content and sleeping on mom's chest.  LC discussed with mom at calling out at next feeding for assistance with putting baby to the breast, and set-up of DEBP if desired at that time. Mom agreed. LC name/number written on whiteboard.  Maternal Data Formula Feeding for Exclusion: No Has patient been taught Hand Expression?: Yes Does the patient have breastfeeding experience prior to this delivery?: No  Feeding Feeding Type: Bottle Fed - Formula Nipple Type: Slow - flow  LATCH Score                   Interventions Interventions: Breast feeding basics reviewed  Lactation Tools Discussed/Used     Consult Status Consult Status: Follow-up Date: 12/30/19 Follow-up type: In-patient    Danford Bad 12/30/2019, 12:41 PM

## 2019-12-30 NOTE — Progress Notes (Signed)
Postpartum Daily Progress Note   29 y.o. G1P1001 s/p C-Section, Low Transverse  POD#: 1 INDICATION: Breech  Interval History:  Lochia: Minimal Pain: well controlled Voiding: without difficulty Ambulating: without difficulty Tolerating Regular Diet: yest Flatus: yes Other issues or symptoms: none  Objective Vitals:  Temp:  [97.6 F (36.4 C)-98.1 F (36.7 C)] 97.6 F (36.4 C) (02/04 0834) Pulse Rate:  [46-87] 50 (02/04 0834) Resp:  [2-37] 20 (02/04 0834) BP: (93-122)/(49-77) 113/55 (02/04 0834) SpO2:  [86 %-100 %] 97 % (02/04 0834)   Physical Exam: C-section: General: alert and cooperative Respiratory: nl effort Cardiovascular: RRR Extremities: No evidence of DVT seen on physical exam. Abdomen: soft, non-tender Incision: Wound vac intact with no visible bleeding Breasts: soft/nontender Perineum: Intact  Data: O POS  Invalid input(s): HEMOGLOBIN Pre-delivery 14.7, post delivery 11.1    A/P:  Active Problems Patient Active Problem List   Diagnosis Date Noted  . stressful living situation 12/09/2019  . Poor fetal growth 12/09/2019  . Breech presentation of fetus 12/09/2019  . Gestational hypertension 11/18/2019  . Gestational hypertension w/o significant proteinuria in 3rd trimester 11/12/2019  . Obesity affecting pregnancy 06/25/2019  . Hypertension in pregnancy, antepartum 12/23/2017  . Chronic tension-type headache, not intractable 07/01/2016  . OSA on CPAP 02/02/2015   Pain: Well managed Feeding: Breast and bottle Rhogam: n/a Diet: Regular Vaccines needed: Flu and Tdap UTD Contraception: none  Postop Post op: Continue routine postop care  Pain:  Motrin, Roxi, Tylenol Diet: Regular DVT ppx: Lovenox subq q 24 hours Foley: d/c'd  Discharge Plan: Plan for d/c home tomorrow  Haroldine Laws, Ina Homes 12/30/2019 12:24 PM

## 2019-12-30 NOTE — Anesthesia Postprocedure Evaluation (Signed)
Anesthesia Post Note  Patient: Pamela Benjamin  Procedure(s) Performed: CESAREAN SECTION (N/A )  Patient location during evaluation: Mother Baby Anesthesia Type: Spinal Level of consciousness: oriented and awake and alert Pain management: pain level controlled Vital Signs Assessment: post-procedure vital signs reviewed and stable Respiratory status: spontaneous breathing, respiratory function stable and patient connected to nasal cannula oxygen Cardiovascular status: blood pressure returned to baseline and stable Postop Assessment: no headache, no backache, no apparent nausea or vomiting and able to ambulate Anesthetic complications: no     Last Vitals:  Vitals:   12/30/19 0500 12/30/19 0700  BP:    Pulse: (!) 52 68  Resp:    Temp:    SpO2: 97% 94%    Last Pain:  Vitals:   12/30/19 0353  TempSrc: Oral  PainSc:                  Rosanne Gutting

## 2019-12-31 MED ORDER — ENOXAPARIN SODIUM 40 MG/0.4ML ~~LOC~~ SOLN: 40.0000 mg | SUBCUTANEOUS | 0 refills | Status: AC

## 2019-12-31 MED ORDER — ACETAMINOPHEN 500 MG PO TABS: 1000.0000 mg | ORAL_TABLET | Freq: Four times a day (QID) | ORAL | 0 refills | Status: AC | PRN

## 2019-12-31 MED ORDER — OXYCODONE HCL 5 MG PO TABS: 5.0000 mg | ORAL_TABLET | Freq: Four times a day (QID) | ORAL | 0 refills | Status: DC | PRN

## 2019-12-31 MED ORDER — IBUPROFEN 600 MG PO TABS: 600.0000 mg | ORAL_TABLET | Freq: Four times a day (QID) | ORAL | 0 refills | Status: AC | PRN

## 2019-12-31 NOTE — Progress Notes (Signed)
Patient discharge with family member. Reviewed discharge instructions with patient and family member to include subq lovenox to be given once a day. Pts family member states she used to be a Engineer, civil (consulting) and knows how to give shots. Reviewed with her how to give shot. Instructed mother that she has a MD appt on Wednesday Feb 10 at 9am to have wound vac removed.

## 2020-04-03 DIAGNOSIS — R001 Bradycardia, unspecified: Secondary | ICD-10-CM | POA: Insufficient documentation

## 2020-06-29 ENCOUNTER — Other Ambulatory Visit: Payer: Self-pay

## 2020-06-29 ENCOUNTER — Emergency Department: Payer: Medicaid Other

## 2020-06-29 ENCOUNTER — Emergency Department
Admission: EM | Admit: 2020-06-29 | Discharge: 2020-06-29 | Disposition: A | Discharge status: Home / Self Care | Payer: Medicaid Other | Attending: Emergency Medicine | Admitting: Emergency Medicine

## 2020-06-29 DIAGNOSIS — I10 Essential (primary) hypertension: Secondary | ICD-10-CM | POA: Diagnosis not present

## 2020-06-29 DIAGNOSIS — K529 Noninfective gastroenteritis and colitis, unspecified: Secondary | ICD-10-CM | POA: Diagnosis not present

## 2020-06-29 DIAGNOSIS — F17210 Nicotine dependence, cigarettes, uncomplicated: Secondary | ICD-10-CM | POA: Insufficient documentation

## 2020-06-29 DIAGNOSIS — J45909 Unspecified asthma, uncomplicated: Secondary | ICD-10-CM | POA: Diagnosis not present

## 2020-06-29 DIAGNOSIS — R1011 Right upper quadrant pain: Secondary | ICD-10-CM

## 2020-06-29 DIAGNOSIS — Z91040 Latex allergy status: Secondary | ICD-10-CM | POA: Diagnosis not present

## 2020-06-29 DIAGNOSIS — R197 Diarrhea, unspecified: Secondary | ICD-10-CM

## 2020-06-29 DIAGNOSIS — K8020 Calculus of gallbladder without cholecystitis without obstruction: Secondary | ICD-10-CM

## 2020-06-29 LAB — CBC WITH DIFFERENTIAL/PLATELET
Abs Immature Granulocytes: 0.05 10*3/uL (ref 0.00–0.07)
Basophils Absolute: 0.1 10*3/uL (ref 0.0–0.1)
Basophils Relative: 1 %
Eosinophils Absolute: 0.3 10*3/uL (ref 0.0–0.5)
Eosinophils Relative: 3 %
HCT: 44.3 % (ref 36.0–46.0)
Hemoglobin: 15.4 g/dL — ABNORMAL HIGH (ref 12.0–15.0)
Immature Granulocytes: 1 %
Lymphocytes Relative: 28 %
Lymphs Abs: 2.5 10*3/uL (ref 0.7–4.0)
MCH: 30.3 pg (ref 26.0–34.0)
MCHC: 34.8 g/dL (ref 30.0–36.0)
MCV: 87 fL (ref 80.0–100.0)
Monocytes Absolute: 0.8 10*3/uL (ref 0.1–1.0)
Monocytes Relative: 9 %
Neutro Abs: 5.3 10*3/uL (ref 1.7–7.7)
Neutrophils Relative %: 58 %
Platelets: 348 10*3/uL (ref 150–400)
RBC: 5.09 MIL/uL (ref 3.87–5.11)
RDW: 12.7 % (ref 11.5–15.5)
WBC: 9 10*3/uL (ref 4.0–10.5)
nRBC: 0 % (ref 0.0–0.2)

## 2020-06-29 LAB — COMPREHENSIVE METABOLIC PANEL
ALT: 18 U/L (ref 0–44)
AST: 15 U/L (ref 15–41)
Albumin: 4.3 g/dL (ref 3.5–5.0)
Alkaline Phosphatase: 93 U/L (ref 38–126)
Anion gap: 13 (ref 5–15)
BUN: 19 mg/dL (ref 6–20)
CO2: 18 mmol/L — ABNORMAL LOW (ref 22–32)
Calcium: 9.2 mg/dL (ref 8.9–10.3)
Chloride: 106 mmol/L (ref 98–111)
Creatinine, Ser: 0.87 mg/dL (ref 0.44–1.00)
GFR calc Af Amer: >60 mL/min (ref >60)
GFR calc non Af Amer: >60 mL/min (ref >60)
Glucose, Bld: 103 mg/dL — ABNORMAL HIGH (ref 70–99)
Potassium: 3.4 mmol/L — ABNORMAL LOW (ref 3.5–5.1)
Sodium: 137 mmol/L (ref 135–145)
Total Bilirubin: 1 mg/dL (ref 0.3–1.2)
Total Protein: 8 g/dL (ref 6.5–8.1)

## 2020-06-29 LAB — URINALYSIS, COMPLETE (UACMP) WITH MICROSCOPIC
Bilirubin Urine: NEGATIVE
Glucose, UA: NEGATIVE mg/dL
Ketones, ur: 5 mg/dL — AB
Leukocytes,Ua: NEGATIVE
Nitrite: NEGATIVE
Protein, ur: 100 mg/dL — AB
RBC / HPF: >50 RBC/hpf — ABNORMAL HIGH (ref 0–5)
Specific Gravity, Urine: 1.033 — ABNORMAL HIGH (ref 1.005–1.030)
pH: 5 (ref 5.0–8.0)

## 2020-06-29 LAB — LIPASE, BLOOD: Lipase: 19 U/L (ref 11–51)

## 2020-06-29 MED ORDER — POTASSIUM CHLORIDE CRYS ER 20 MEQ PO TBCR
40.0000 meq | EXTENDED_RELEASE_TABLET | Freq: Once | ORAL | Status: AC
  Administered 2020-06-29: 40 meq via ORAL
  Filled 2020-06-29: qty 2

## 2020-06-29 MED ORDER — ONDANSETRON HCL 4 MG/2ML IJ SOLN
4.0000 mg | Freq: Once | INTRAMUSCULAR | Status: AC
  Administered 2020-06-29: 4 mg via INTRAVENOUS
  Filled 2020-06-29: qty 2

## 2020-06-29 MED ORDER — DICYCLOMINE HCL 10 MG PO CAPS: 10.0000 mg | ORAL_CAPSULE | Freq: Four times a day (QID) | ORAL | 0 refills | Status: AC | PRN

## 2020-06-29 MED ORDER — SODIUM CHLORIDE 0.9 % IV BOLUS
1000.0000 mL | Freq: Once | INTRAVENOUS | Status: AC
  Administered 2020-06-29: 1000 mL via INTRAVENOUS

## 2020-06-29 MED ORDER — ONDANSETRON 4 MG PO TBDP: 4.0000 mg | ORAL_TABLET | Freq: Three times a day (TID) | ORAL | 0 refills | Status: AC | PRN

## 2020-06-29 NOTE — ED Triage Notes (Signed)
Pt arrives via POV for c/o vomiting, diarrhea and fever x 3 days. Pt states she thinks she may have food poisoning. Pt ambulatory from lobby in NAD, skin warm and dry.

## 2020-06-29 NOTE — ED Provider Notes (Signed)
[facility] Emergency Department Provider Note  ____________________________________________  Time seen: Approximately 7:22 PM  I have reviewed the triage vital signs and the nursing notes.   HISTORY  Chief Complaint Emesis and Diarrhea    HPI Pamela Benjamin is a 29 y.o. female who presents the emergency department complaining of right upper quadrant abdominal pain, nausea vomiting, diarrhea.  Patient states 3 days of symptoms.  No fevers or chills.  Patient has no upper respiratory complaints of nasal congestion, sore throat, cough.  Right upper quadrant pain is mild in nature.  No other abdominal pain.  Patient states diarrhea is watery.  No hematemesis and no hematochezia.  Patient has had no bilious vomit.  No history of gallbladder issues.  Patient has a history of ADHD, asthma, hypertension, sleep apnea.  No complaints of chronic medical issues.  No medications prior to arrival.         Past Medical History:  Diagnosis Date  . ADHD (attention deficit hyperactivity disorder)   . Asthma   . Hypertension    Propanolol 2 pills bid, pt unaware of dose  . Mood disorder (HCC)   . Sleep apnea     Patient Active Problem List   Diagnosis Date Noted  . stressful living situation 12/09/2019  . Poor fetal growth 12/09/2019  . Breech presentation of fetus 12/09/2019  . Gestational hypertension 11/18/2019  . Gestational hypertension w/o significant proteinuria in 3rd trimester 11/12/2019  . Obesity affecting pregnancy 06/25/2019  . Hypertension in pregnancy, antepartum 12/23/2017  . Chronic tension-type headache, not intractable 07/01/2016  . OSA on CPAP 02/02/2015    Past Surgical History:  Procedure Laterality Date  . CESAREAN SECTION N/A 12/29/2019   Procedure: CESAREAN SECTION;  Surgeon: Ward, Elenora Fenderhelsea C, MD;  Location: [facility] ORS;  Service: Obstetrics;  Laterality: N/A;  . NO PAST SURGERIES      Prior to Admission medications   Medication Sig Start  Date End Date Taking? Authorizing Provider  acetaminophen (TYLENOL) 500 MG tablet Take 2 tablets (1,000 mg total) by mouth every 6 (six) hours as needed for mild pain or moderate pain. 12/31/19 12/30/20  Genia DelHaviland, Margaret, CNM  albuterol (PROVENTIL HFA;VENTOLIN HFA) 108 (90 Base) MCG/ACT inhaler Inhale 2 puffs into the lungs.    [provider]  dicyclomine (BENTYL) 10 MG capsule Take 1 capsule (10 mg total) by mouth 4 (four) times daily as needed for up to 14 days (abdominal cramps and pain). 06/29/20 07/13/20  Diante Barley, Delorise RoyalsJonathan D, PA-C  enoxaparin (LOVENOX) 40 MG/0.4ML injection Inject 0.4 mLs (40 mg total) into the skin daily. 01/01/20   Genia DelHaviland, Margaret, CNM  EPINEPHrine 0.3 mg/0.3 mL IJ SOAJ injection Inject into the muscle.    [provider]  ibuprofen (ADVIL) 600 MG tablet Take 1 tablet (600 mg total) by mouth every 6 (six) hours as needed for mild pain, moderate pain or cramping. 12/31/19   Genia DelHaviland, Margaret, CNM  ondansetron (ZOFRAN-ODT) 4 MG disintegrating tablet Take 1 tablet (4 mg total) by mouth every 8 (eight) hours as needed for nausea or vomiting. 06/29/20   Drexel Ivey, Delorise RoyalsJonathan D, PA-C  oxyCODONE (OXY IR/ROXICODONE) 5 MG immediate release tablet Take 1 tablet (5 mg total) by mouth every 6 (six) hours as needed for severe pain. 12/31/19   Genia DelHaviland, Margaret, CNM  Prenatal Vit-Fe Fumarate-FA (PRENATAL MULTIVITAMIN) TABS tablet Take 1 tablet by mouth daily at 12 noon.    [provider]    Allergies Other, Pollen extract, and Latex  Family History  Adopted: Yes  Problem Relation Age of Onset  . Breast cancer Neg Hx   . Ovarian cancer Neg Hx   . Colon cancer Neg Hx     Social History Social History   Tobacco Use  . Smoking status: Current Every Day Smoker    Packs/day: 1.00    Years: 12.00    Pack years: 12.00    Types: Cigarettes    Start date: 11/25/2006  . Smokeless tobacco: Never Used  . Tobacco comment:  3cigarettes a day  Vaping Use  . Vaping  Use: Never used  Substance Use Topics  . Alcohol use: No  . Drug use: No     Review of Systems  Constitutional: No fever/chills Eyes: No visual changes. No discharge ENT: No upper respiratory complaints. Cardiovascular: no chest pain. Respiratory: no cough. No SOB. Gastrointestinal: Right upper quadrant abdominal pain.  Nausea, vomiting, diarrhea..   No constipation. Genitourinary: Negative for dysuria. No hematuria Musculoskeletal: Negative for musculoskeletal pain. Skin: Negative for rash, abrasions, lacerations, ecchymosis. Neurological: Negative for headaches, focal weakness or numbness. 10-point ROS otherwise negative.  ____________________________________________   PHYSICAL EXAM:  VITAL SIGNS: ED Triage Vitals  Enc Vitals Group     BP 06/29/20 1629 128/60     Pulse Rate 06/29/20 1629 98     Resp 06/29/20 1629 18     Temp 06/29/20 1629 98.1 F (36.7 C)     Temp Source 06/29/20 1629 Oral     SpO2 06/29/20 1629 99 %     Weight 06/29/20 1630 275 lb (124.7 kg)     Height 06/29/20 1630 5\' 4"  (1.626 m)     Head Circumference --      Peak Flow --      Pain Score 06/29/20 1630 0     Pain Loc --      Pain Edu? --      Excl. in GC? --      Constitutional: Alert and oriented. Well appearing and in no acute distress. Eyes: Conjunctivae are normal. PERRL. EOMI. Head: Atraumatic. ENT:      Ears:       Nose: No congestion/rhinnorhea.      Mouth/Throat: Mucous membranes are moist.  Neck: No stridor.    Cardiovascular: Normal rate, regular rhythm. Normal S1 and S2.  Good peripheral circulation. Respiratory: Normal respiratory effort without tachypnea or retractions. Lungs CTAB. Good air entry to the bases with no decreased or absent breath sounds. Gastrointestinal: Hyperactive bowel sounds 4 quadrants.  Soft to palpation all quadrants.  Patient is tender to palpation of the right upper quadrant.  No other tenderness to palpation.  No palpable abnormalities.. No guarding  or rigidity. No palpable masses. No distention. No CVA tenderness. Musculoskeletal: Full range of motion to all extremities. No gross deformities appreciated. Neurologic:  Normal speech and language. No gross focal neurologic deficits are appreciated.  Skin:  Skin is warm, dry and intact. No rash noted. Psychiatric: Mood and affect are normal. Speech and behavior are normal. Patient exhibits appropriate insight and judgement.   ____________________________________________   LABS (all labs ordered are listed, but only abnormal results are displayed)  Labs Reviewed  COMPREHENSIVE METABOLIC PANEL - Abnormal; Notable for the following components:      Result Value   Potassium 3.4 (*)    CO2 18 (*)    Glucose, Bld 103 (*)    All other components within normal limits  CBC WITH DIFFERENTIAL/PLATELET - Abnormal; Notable for the following  components:   Hemoglobin 15.4 (*)    All other components within normal limits  URINALYSIS, COMPLETE (UACMP) WITH MICROSCOPIC - Abnormal; Notable for the following components:   Color, Urine AMBER (*)    APPearance CLOUDY (*)    Specific Gravity, Urine 1.033 (*)    Hgb urine dipstick LARGE (*)    Ketones, ur 5 (*)    Protein, ur 100 (*)    RBC / HPF >50 (*)    Bacteria, UA RARE (*)    All other components within normal limits  LIPASE, BLOOD  POC URINE PREG, ED   ____________________________________________  EKG   ____________________________________________  RADIOLOGY I personally viewed and evaluated these images as part of my medical decision making, as well as reviewing the written report by the radiologist.  US Abdomen Limited RUQ  Result Date: 06/29/2020 CLINICAL DATA:  Right upper quadrant pain EXAM: ULTRASOUND ABDOMEN LIMITED RIGHT UPPER QUADRANT COMPARISON:  08/11/2016 FINDINGS: Gallbladder: Diffuse shadowing the gallbladder fossa consistent with multiple stones. Normal wall thickness. Negative sonographic Murphy. Common bile duct:  Diameter: 2.2 mm Liver: No focal lesion identified. Within normal limits in parenchymal echogenicity. Portal vein is patent on color Doppler imaging with normal direction of blood flow towards the liver. Other: None. IMPRESSION: Cholelithiasis without sonographic evidence for acute cholecystitis. Electronically Signed   By: Jasmine Pang M.D.   On: 06/29/2020 20:27    ____________________________________________    PROCEDURES  Procedure(s) performed:    Procedures    Medications  potassium chloride SA (KLOR-CON) CR tablet 40 mEq (has no administration in time range)  sodium chloride 0.9 % bolus 1,000 mL (1,000 mLs Intravenous New Bag/Given 06/29/20 2043)  ondansetron (ZOFRAN) injection 4 mg (4 mg Intravenous Given 06/29/20 2043)     ____________________________________________   INITIAL IMPRESSION / ASSESSMENT AND PLAN / ED COURSE  Pertinent labs & imaging results that were available during my care of the patient were reviewed by me and considered in my medical decision making (see chart for details).  Review of the [place] CSRS was performed in accordance of the [facility] prior to dispensing any controlled drugs.           Patient's diagnosis is consistent with nausea, vomiting, diarrhea, cholelithiasis.  Patient presented to emergency department symptoms consistent with viral gastroenteritis.  Patient was evaluated labs, ultrasound of the right upper quadrant she did have some mild tenderness in this region.  No evidence of cholecystitis but patient does have a finding consistent with cholelithiasis.  Otherwise labs are reassuring.  Slight drop in her potassium which is consistent with loss from vomiting and diarrhea.  Patient has oral replenishment it is instructed to drink electrolyte containing fluids or eat a banana.  Patient will be given Zofran and Bentyl for symptom relief.  Follow-up primary care as needed..  Differential included viral gastroenteritis, cholecystitis, cholelithiasis,  diverticulitis, urinary tract infection.  Patient is given ED precautions to return to the ED for any worsening or new symptoms.     ____________________________________________  FINAL CLINICAL IMPRESSION(S) / ED DIAGNOSES  Final diagnoses:  RUQ pain  Nausea, vomiting and diarrhea  Gastroenteritis  Calculus of gallbladder without cholecystitis without obstruction      NEW MEDICATIONS STARTED DURING THIS VISIT:  ED Discharge Orders         Ordered    ondansetron (ZOFRAN-ODT) 4 MG disintegrating tablet  Every 8 hours PRN     Discontinue  Reprint     06/29/20 2207    dicyclomine (BENTYL)  10 MG capsule  4 times daily PRN     Discontinue  Reprint     06/29/20 2207              This chart was dictated using voice recognition software/Dragon. Despite best efforts to proofread, errors can occur which can change the meaning. Any change was purely unintentional.    Racheal Patches, PA-C 06/29/20 2209    Chesley Noon, MD 06/29/20 [phone]

## 2020-09-25 IMAGING — US US OB LIMITED
1 series · 14 of 28 positions shown · non-contrast
Comparison: none

CLINICAL DATA: Gestational hypertension in 3rd trimester pregnancy.
IUGR.

EXAM:
LIMITED OBSTETRIC ULTRASOUND

[Series 1: us ob limited · 14 of 30 slices shown]
[im 2/30]
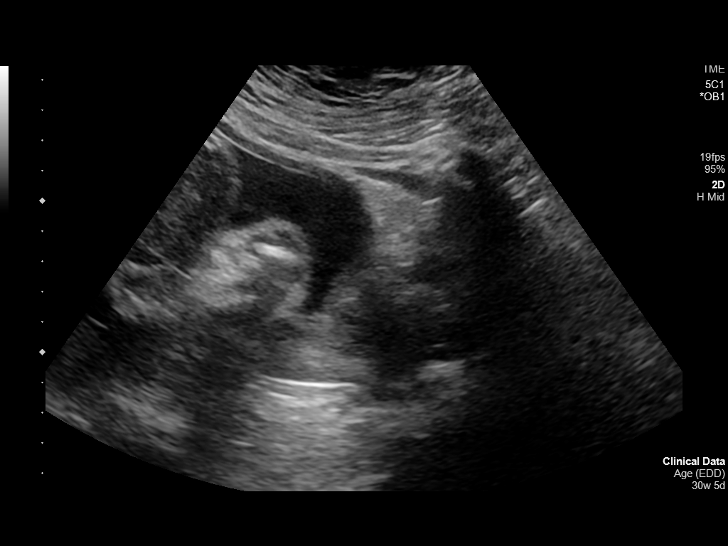
[im 4/30]
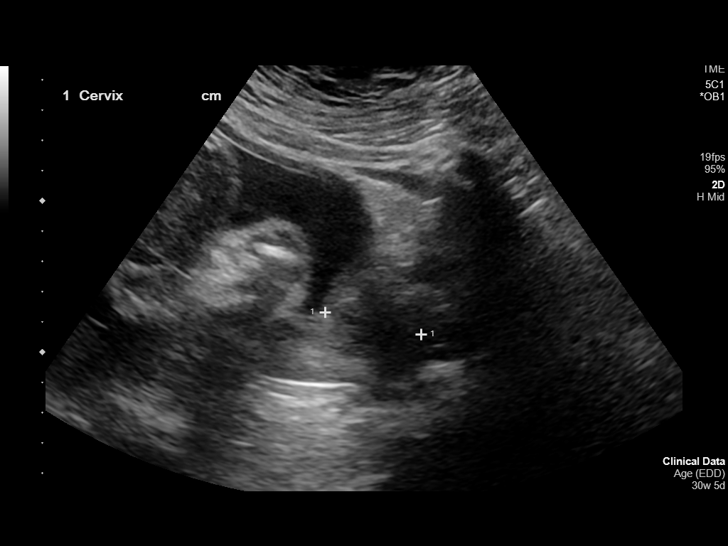
[im 6/30]
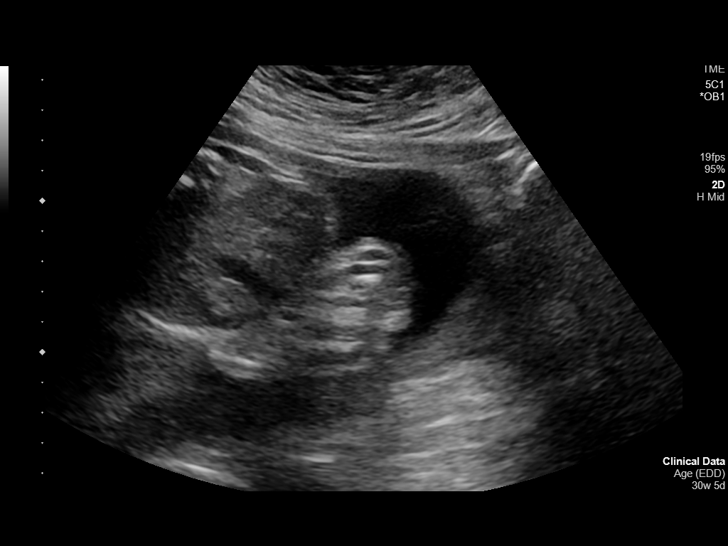
[im 8/30]
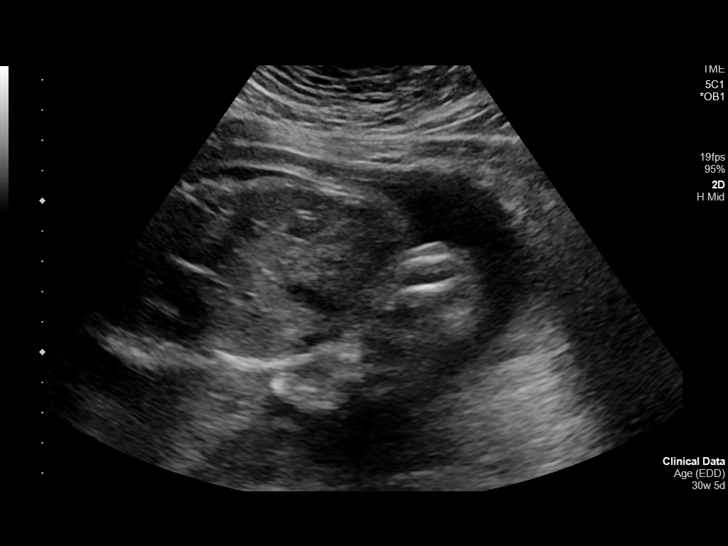
[im 10/30]
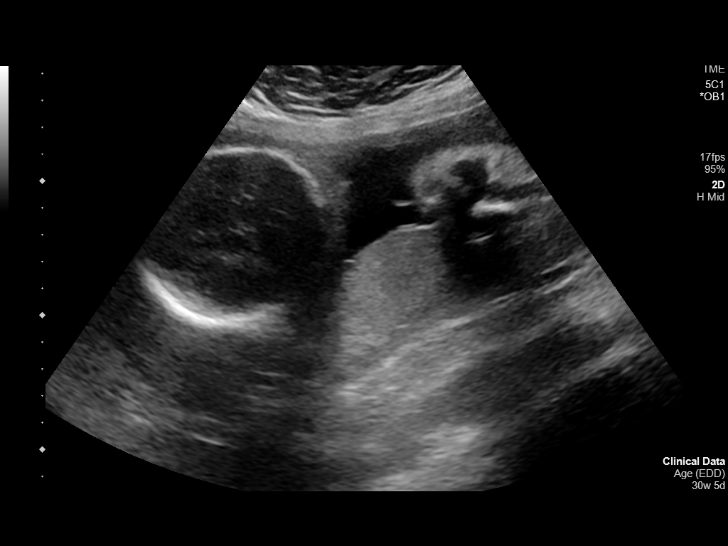
[im 12/30]
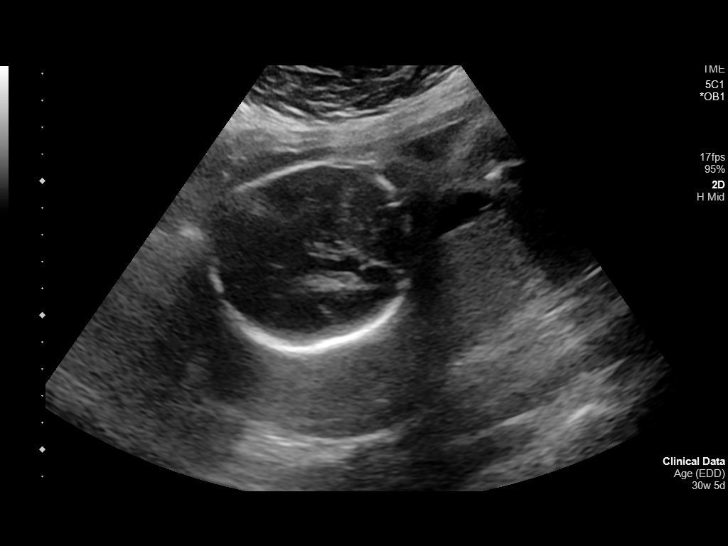
[im 14/30]
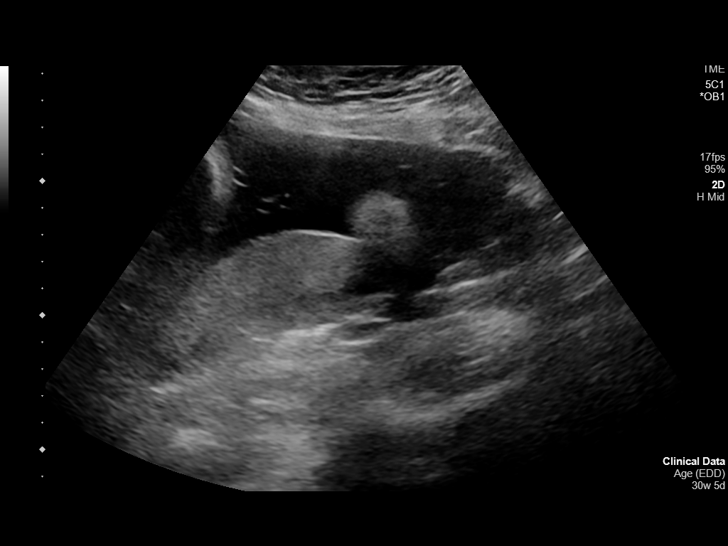
[im 17/30]
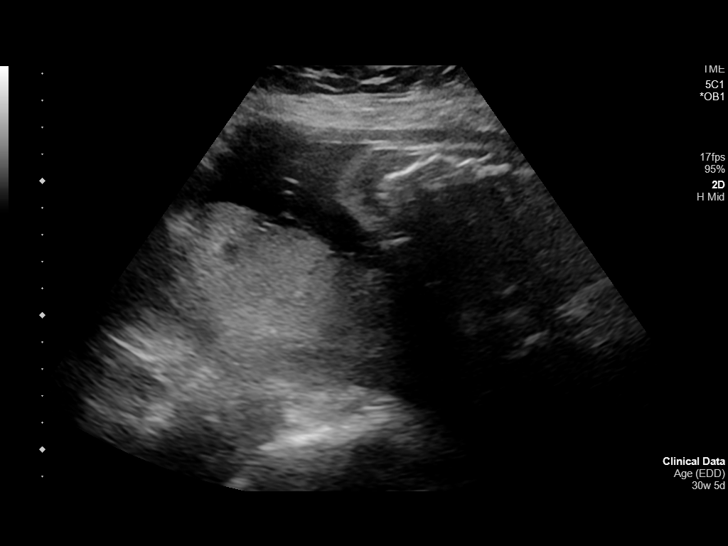
[im 19/30]
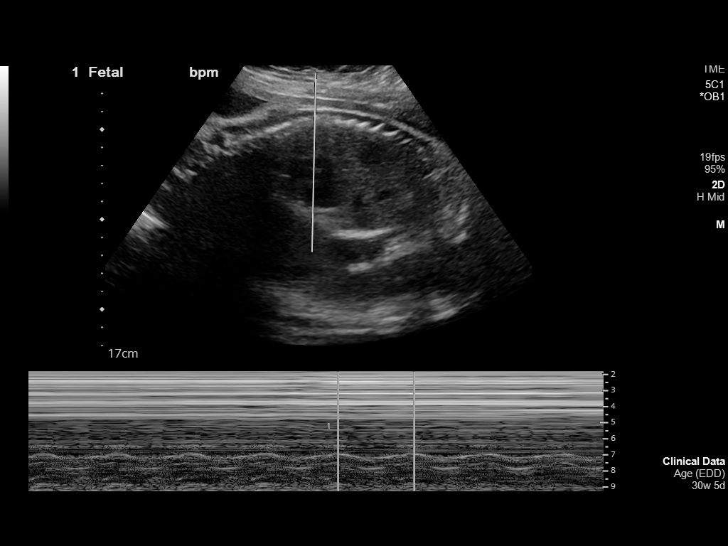
[im 21/30]
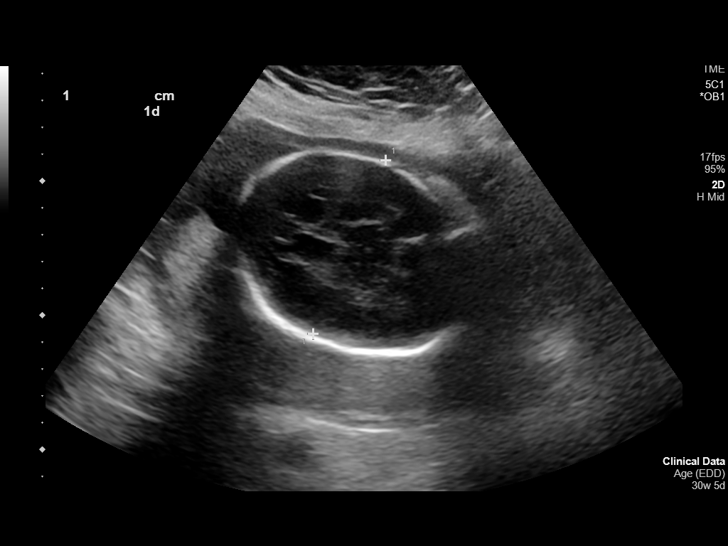
[im 23/30]
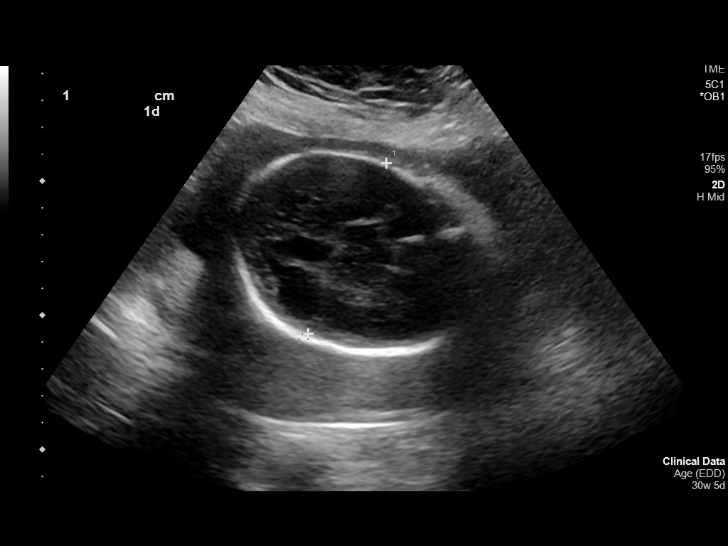
[im 25/30]
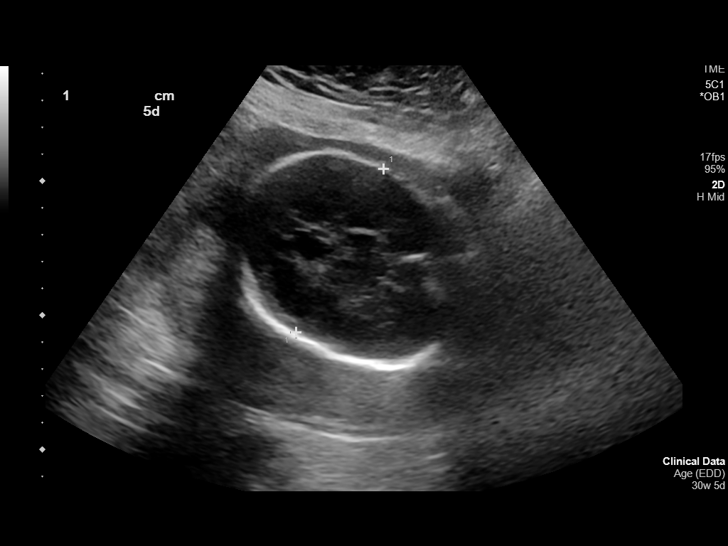
[im 27/30]
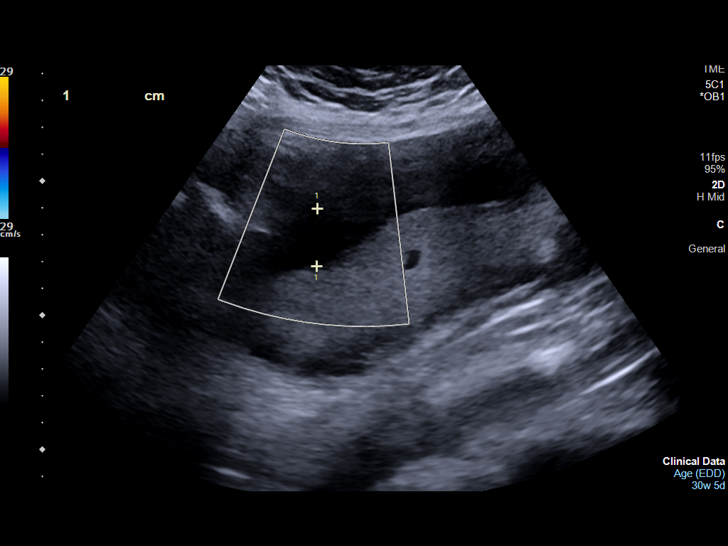
[im 30/30]
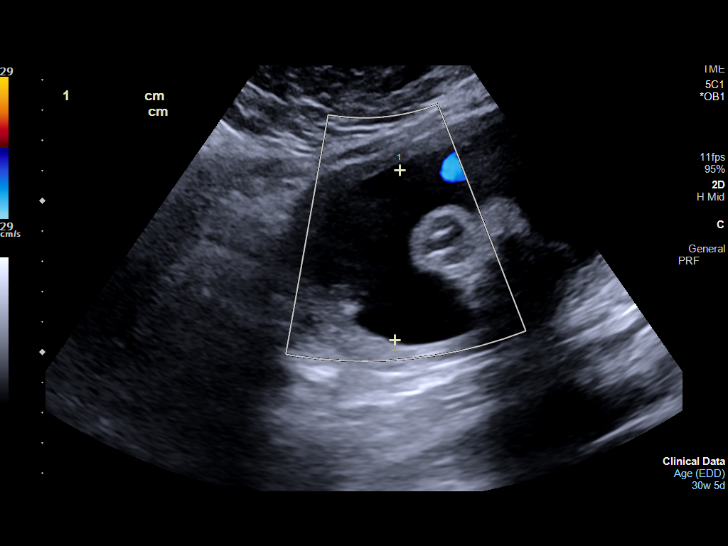

[14 of 28 positions shown; findings below may reference images not displayed]

FINDINGS: Number of Fetuses: 1

Heart Rate:  135 bpm

Movement: Yes

Presentation: Breech

Placental Location: Posterior

Previa: No

Amniotic Fluid (Subjective):  Within normal limits.

AFI: 13.3 cm

BPD: 7.0 cm 28 w  0 d

MATERNAL FINDINGS:

Cervix: Appears closed, and measures approximately 3.3 cm in length.
IMPRESSION: Single living intrauterine fetus in breech presentation.

Amniotic fluid volume within normal limits, with AFI of 13.3 cm.

This exam is performed on an emergent basis and does not
comprehensively evaluate fetal size, dating, or anatomy; follow-up
complete OB US should be considered if further fetal assessment is
warranted.

## 2021-04-17 ENCOUNTER — Other Ambulatory Visit: Payer: Self-pay

## 2021-04-17 ENCOUNTER — Ambulatory Visit
Admission: EM | Admit: 2021-04-17 | Discharge: 2021-04-17 | Disposition: A | Discharge status: Home / Self Care | Payer: Medicaid Other

## 2021-04-17 ENCOUNTER — Encounter: Payer: Self-pay | Admitting: Emergency Medicine

## 2021-04-17 DIAGNOSIS — M79671 Pain in right foot: Secondary | ICD-10-CM | POA: Diagnosis not present

## 2021-04-17 MED ORDER — MELOXICAM 15 MG PO TABS
15.0000 mg | ORAL_TABLET | Freq: Every day | ORAL | 0 refills | Status: AC
Start: 2021-04-17 — End: 2021-05-02

## 2021-04-17 NOTE — ED Provider Notes (Signed)
[facility]    CSN: [id number] Arrival date & time: 04/17/21  1919      History   Chief Complaint Chief Complaint  Patient presents with  . Foot Pain    right    HPI Pamela Benjamin is a 30 y.o. female presenting for plantar and dorsal right foot pain and swelling that started yesterday.  Patient states that her pain is worse in the morning.  Also admits to a lot of pain over she tries to bear weight on the foot or when she touches the bottom on the top of her foot.  Patient states she had a similar issue a couple months ago and was seen at [facility].  She says she was given something to help with the pain and her symptoms improved.  Patient says she has to be on her feet a lot at work and has not been able to work today due to the pain and does not believe she will build to work tomorrow there.  She has not taking anything for pain relief today.  She says she has iced it and tried to elevate it.  Patient has no other complaints or concerns today.  Past medical history significant for asthma, hypertension, ADHD, and sleep apnea.  Patient has Lovenox in her medication list, but she does not take the medication currently.  HPI  Past Medical History:  Diagnosis Date  . ADHD (attention deficit hyperactivity disorder)   . Asthma   . Hypertension    Propanolol 2 pills bid, pt unaware of dose  . Mood disorder (HCC)   . Sleep apnea     Patient Active Problem List   Diagnosis Date Noted  . stressful living situation 12/09/2019  . Poor fetal growth 12/09/2019  . Breech presentation of fetus 12/09/2019  . Gestational hypertension 11/18/2019  . Gestational hypertension w/o significant proteinuria in 3rd trimester 11/12/2019  . Obesity affecting pregnancy 06/25/2019  . Hypertension in pregnancy, antepartum 12/23/2017  . Chronic tension-type headache, not intractable 07/01/2016  . OSA on CPAP 02/02/2015    Past Surgical History:  Procedure Laterality Date  .  CESAREAN SECTION N/A 12/29/2019   Procedure: CESAREAN SECTION;  Surgeon: Ward, Elenora Fender, MD;  Location: [facility] ORS;  Service: Obstetrics;  Laterality: N/A;  . NO PAST SURGERIES      OB History    Gravida  1   Para  1   Term  1   Preterm  0   AB  0   Living  1     SAB  0   IAB  0   Ectopic  0   Multiple  0   Live Births  1            Home Medications    Prior to Admission medications   Medication Sig Start Date End Date Taking? Authorizing Provider  meloxicam (MOBIC) 15 MG tablet Take 1 tablet (15 mg total) by mouth daily for 15 days. 04/17/21 05/02/21 Yes Eusebio Friendly B, PA-C  sertraline (ZOLOFT) 50 MG tablet Take by mouth. 02/10/20  Yes [provider]  albuterol (PROVENTIL HFA;VENTOLIN HFA) 108 (90 Base) MCG/ACT inhaler Inhale 2 puffs into the lungs.    [provider]  dicyclomine (BENTYL) 10 MG capsule Take 1 capsule (10 mg total) by mouth 4 (four) times daily as needed for up to 14 days (abdominal cramps and pain). 06/29/20 07/13/20  Cuthriell, Delorise Royals, PA-C  enoxaparin (LOVENOX) 40 MG/0.4ML injection Inject 0.4  mLs (40 mg total) into the skin daily. 01/01/20   Genia Del, CNM  EPINEPHrine 0.3 mg/0.3 mL IJ SOAJ injection Inject into the muscle.    [provider]  ibuprofen (ADVIL) 600 MG tablet Take 1 tablet (600 mg total) by mouth every 6 (six) hours as needed for mild pain, moderate pain or cramping. 12/31/19   Genia Del, CNM  ondansetron (ZOFRAN-ODT) 4 MG disintegrating tablet Take 1 tablet (4 mg total) by mouth every 8 (eight) hours as needed for nausea or vomiting. 06/29/20   Cuthriell, Delorise Royals, PA-C  oxyCODONE (OXY IR/ROXICODONE) 5 MG immediate release tablet Take 1 tablet (5 mg total) by mouth every 6 (six) hours as needed for severe pain. 12/31/19   Genia Del, CNM  Prenatal Vit-Fe Fumarate-FA (PRENATAL MULTIVITAMIN) TABS tablet Take 1 tablet by mouth daily at 12 noon.    [provider]    Family  History Family History  Adopted: Yes  Problem Relation Age of Onset  . Breast cancer Neg Hx   . Ovarian cancer Neg Hx   . Colon cancer Neg Hx     Social History Social History   Tobacco Use  . Smoking status: Current Every Day Smoker    Packs/day: 1.00    Years: 12.00    Pack years: 12.00    Types: Cigarettes    Start date: 11/25/2006  . Smokeless tobacco: Never Used  . Tobacco comment:  3cigarettes a day  Vaping Use  . Vaping Use: Never used  Substance Use Topics  . Alcohol use: No  . Drug use: No     Allergies   Other, Pollen extract, and Latex   Review of Systems Review of Systems  Constitutional: Negative for fatigue.  Musculoskeletal: Positive for arthralgias, gait problem and joint swelling.  Skin: Negative for color change, rash and wound.  Neurological: Negative for weakness and numbness.     Physical Exam Triage Vital Signs ED Triage Vitals  Enc Vitals Group     BP 04/17/21 1951 (!) 154/84     Pulse Rate 04/17/21 1951 85     Resp 04/17/21 1951 18     Temp 04/17/21 1951 98.7 F (37.1 C)     Temp Source 04/17/21 1951 Oral     SpO2 04/17/21 1951 99 %     Weight 04/17/21 1949 274 lb 14.6 oz (124.7 kg)     Height 04/17/21 1949 5\' 4"  (1.626 m)     Head Circumference --      Peak Flow --      Pain Score 04/17/21 1948 9     Pain Loc --      Pain Edu? --      Excl. in GC? --    No data found.  Updated Vital Signs BP (!) 154/84 (BP Location: Right Arm)   Pulse 85   Temp 98.7 F (37.1 C) (Oral)   Resp 18   Ht 5\' 4"  (1.626 m)   Wt 274 lb 14.6 oz (124.7 kg)   LMP 04/07/2021 (Approximate)   SpO2 99%   Breastfeeding No   BMI 47.19 kg/m    Physical Exam Vitals and nursing note reviewed.  Constitutional:      General: She is not in acute distress.    Appearance: Normal appearance. She is obese. She is not ill-appearing or toxic-appearing.  HENT:     Head: Normocephalic and atraumatic.  Eyes:     General: No scleral icterus.  Right  eye: No discharge.        Left eye: No discharge.     Conjunctiva/sclera: Conjunctivae normal.  Cardiovascular:     Rate and Rhythm: Normal rate and regular rhythm.     Heart sounds: Normal heart sounds.  Pulmonary:     Effort: Pulmonary effort is normal. No respiratory distress.     Breath sounds: Normal breath sounds.  Musculoskeletal:     Cervical back: Neck supple.     Right foot: Decreased range of motion. Tenderness (TTP diffusely dorsal metatarsal. TTP calcaneus and plantar foot diffusely) present. No swelling or deformity.  Skin:    General: Skin is dry.  Neurological:     General: No focal deficit present.     Mental Status: She is alert. Mental status is at baseline.     Motor: No weakness.     Gait: Gait normal.  Psychiatric:        Mood and Affect: Mood normal.        Behavior: Behavior normal.        Thought Content: Thought content normal.      UC Treatments / Results  Labs (all labs ordered are listed, but only abnormal results are displayed) Labs Reviewed - No data to display  EKG   Radiology No results found.  Procedures Procedures (including critical care time)  Medications Ordered in UC Medications - No data to display  Initial Impression / Assessment and Plan / UC Course  I have reviewed the triage vital signs and the nursing notes.  Pertinent labs & imaging results that were available during my care of the patient were reviewed by me and considered in my medical decision making (see chart for details).   30 year old female presenting for atraumatic right foot pain.  Suspect possible plantar fasciitis.  Treating at this time and supportive care.  I have sent in meloxicam.  Also advised over-the-counter Tylenol, Voltaren gel, stretches.  Work note given.  Advised to follow-up with Ortho if not improving the next 7 to 10 days for any worsening symptoms.   Final Clinical Impressions(s) / UC Diagnoses   Final diagnoses:  Foot pain, right      Discharge Instructions     Your foot pain is likely due to plantar fasciitis.  Please see the handout and stretches attached.  I have sent an anti-inflammatory to the pharmacy.  You can also take over-the-counter Tylenol for more pain relief if you need it and purchase over-the-counter Voltaren gel and apply this area if needed as well.  Ice the area multiple times throughout the day.  Try to stretch often.  We also talked about rolling your foot over a tennis ball.  There are also specific splints that you can wear at night to help.  You can get these over-the-counter.  Also you can purchase an over-the-counter Ace wrap.  If symptoms or not improving over the next 7 to 10 days you should be seen again.  I would consider going to [facility] walk-in urgent care in [facility].    ED Prescriptions    Medication Sig Dispense Auth. Provider   meloxicam (MOBIC) 15 MG tablet Take 1 tablet (15 mg total) by mouth daily for 15 days. 15 tablet Gareth Morgan     PDMP not reviewed this encounter.   Shirlee Latch, PA-C 04/17/21 2041

## 2021-04-17 NOTE — ED Triage Notes (Signed)
Pt c/o right foot pain, and swelling. Started yesterday. Swelling and pain worse this morning. She states she could hardly put pressure on her foot today. No known injury.

## 2021-04-17 NOTE — Discharge Instructions (Signed)
Your foot pain is likely due to plantar fasciitis.  Please see the handout and stretches attached.  I have sent an anti-inflammatory to the pharmacy.  You can also take over-the-counter Tylenol for more pain relief if you need it and purchase over-the-counter Voltaren gel and apply this area if needed as well.  Ice the area multiple times throughout the day.  Try to stretch often.  We also talked about rolling your foot over a tennis ball.  There are also specific splints that you can wear at night to help.  You can get these over-the-counter.  Also you can purchase an over-the-counter Ace wrap.  If symptoms or not improving over the next 7 to 10 days you should be seen again.  I would consider going to [facility] walk-in urgent care in  [place]. 

## 2021-05-22 ENCOUNTER — Emergency Department: Payer: Medicaid Other

## 2021-05-22 ENCOUNTER — Other Ambulatory Visit: Payer: Self-pay

## 2021-05-22 ENCOUNTER — Emergency Department
Admission: EM | Admit: 2021-05-22 | Discharge: 2021-05-22 | Disposition: A | Discharge status: Home / Self Care | Payer: Medicaid Other | Attending: Emergency Medicine | Admitting: Emergency Medicine

## 2021-05-22 DIAGNOSIS — S99922A Unspecified injury of left foot, initial encounter: Secondary | ICD-10-CM | POA: Diagnosis present

## 2021-05-22 DIAGNOSIS — Y92009 Unspecified place in unspecified non-institutional (private) residence as the place of occurrence of the external cause: Secondary | ICD-10-CM | POA: Insufficient documentation

## 2021-05-22 DIAGNOSIS — Z91040 Latex allergy status: Secondary | ICD-10-CM | POA: Insufficient documentation

## 2021-05-22 DIAGNOSIS — F17210 Nicotine dependence, cigarettes, uncomplicated: Secondary | ICD-10-CM | POA: Diagnosis not present

## 2021-05-22 DIAGNOSIS — W1839XA Other fall on same level, initial encounter: Secondary | ICD-10-CM | POA: Insufficient documentation

## 2021-05-22 DIAGNOSIS — J45909 Unspecified asthma, uncomplicated: Secondary | ICD-10-CM | POA: Insufficient documentation

## 2021-05-22 DIAGNOSIS — S92355A Nondisplaced fracture of fifth metatarsal bone, left foot, initial encounter for closed fracture: Secondary | ICD-10-CM | POA: Diagnosis not present

## 2021-05-22 DIAGNOSIS — I10 Essential (primary) hypertension: Secondary | ICD-10-CM | POA: Diagnosis not present

## 2021-05-22 DIAGNOSIS — Z79899 Other long term (current) drug therapy: Secondary | ICD-10-CM | POA: Diagnosis not present

## 2021-05-22 MED ORDER — IBUPROFEN 800 MG PO TABS
800.0000 mg | ORAL_TABLET | Freq: Once | ORAL | Status: AC
  Administered 2021-05-22: 21:00:00 800 mg via ORAL
  Filled 2021-05-22: qty 1

## 2021-05-22 MED ORDER — MELOXICAM 15 MG PO TABS
15.0000 mg | ORAL_TABLET | Freq: Every day | ORAL | 0 refills | Status: AC
Start: 2021-05-22 — End: ?

## 2021-05-22 MED ORDER — HYDROCODONE-ACETAMINOPHEN 5-325 MG PO TABS: 1.0000 | ORAL_TABLET | Freq: Four times a day (QID) | ORAL | 0 refills | Status: AC | PRN

## 2021-05-22 MED ORDER — HYDROCODONE-ACETAMINOPHEN 5-325 MG PO TABS
1.0000 | ORAL_TABLET | Freq: Once | ORAL | Status: AC
  Administered 2021-05-22: 22:00:00 1 via ORAL
  Filled 2021-05-22: qty 1

## 2021-05-22 NOTE — ED Provider Notes (Signed)
[facility] Emergency Department Provider Note ____________________________________________  Time seen: Approximately 9:20 PM  I have reviewed the triage vital signs and the nursing notes.   HISTORY  Chief Complaint Ankle Pain    HPI Pamela Benjamin is a 30 y.o. female who presents to the emergency department for evaluation and treatment of left foot and ankle pain after mechanical, nonsyncopal fall at home prior to arrival.  No alleviating measures attempted prior to arrival.  Past Medical History:  Diagnosis Date   ADHD (attention deficit hyperactivity disorder)    Asthma    Hypertension    Propanolol 2 pills bid, pt unaware of dose   Mood disorder (HCC)    Sleep apnea     Patient Active Problem List   Diagnosis Date Noted   stressful living situation 12/09/2019   Poor fetal growth 12/09/2019   Breech presentation of fetus 12/09/2019   Gestational hypertension 11/18/2019   Gestational hypertension w/o significant proteinuria in 3rd trimester 11/12/2019   Obesity affecting pregnancy 06/25/2019   Hypertension in pregnancy, antepartum 12/23/2017   Chronic tension-type headache, not intractable 07/01/2016   OSA on CPAP 02/02/2015    Past Surgical History:  Procedure Laterality Date   CESAREAN SECTION N/A 12/29/2019   Procedure: CESAREAN SECTION;  Surgeon: Ward, Elenora Fender, MD;  Location: [facility] ORS;  Service: Obstetrics;  Laterality: N/A;   NO PAST SURGERIES      Prior to Admission medications   Medication Sig Start Date End Date Taking? Authorizing Provider  HYDROcodone-acetaminophen (NORCO/VICODIN) 5-325 MG tablet Take 1 tablet by mouth every 6 (six) hours as needed for up to 3 days for severe pain. 05/22/21 05/25/21 Yes Marshella Tello B, FNP  meloxicam (MOBIC) 15 MG tablet Take 1 tablet (15 mg total) by mouth daily. 05/22/21  Yes Makaylin Carlo B, FNP  albuterol (PROVENTIL HFA;VENTOLIN HFA) 108 (90 Base) MCG/ACT inhaler Inhale 2 puffs into the lungs.     [provider]  dicyclomine (BENTYL) 10 MG capsule Take 1 capsule (10 mg total) by mouth 4 (four) times daily as needed for up to 14 days (abdominal cramps and pain). 06/29/20 07/13/20  Cuthriell, Delorise Royals, PA-C  enoxaparin (LOVENOX) 40 MG/0.4ML injection Inject 0.4 mLs (40 mg total) into the skin daily. 01/01/20   Genia Del, CNM  EPINEPHrine 0.3 mg/0.3 mL IJ SOAJ injection Inject into the muscle.    [provider]  ibuprofen (ADVIL) 600 MG tablet Take 1 tablet (600 mg total) by mouth every 6 (six) hours as needed for mild pain, moderate pain or cramping. 12/31/19   Genia Del, CNM  ondansetron (ZOFRAN-ODT) 4 MG disintegrating tablet Take 1 tablet (4 mg total) by mouth every 8 (eight) hours as needed for nausea or vomiting. 06/29/20   Cuthriell, Delorise Royals, PA-C  Prenatal Vit-Fe Fumarate-FA (PRENATAL MULTIVITAMIN) TABS tablet Take 1 tablet by mouth daily at 12 noon.    [provider]  sertraline (ZOLOFT) 50 MG tablet Take by mouth. 02/10/20   [provider]    Allergies Other, Pollen extract, and Latex  Family History  Adopted: Yes  Problem Relation Age of Onset   Breast cancer Neg Hx    Ovarian cancer Neg Hx    Colon cancer Neg Hx     Social History Social History   Tobacco Use   Smoking status: Every Day    Packs/day: 1.00    Years: 12.00    Pack years: 12.00    Types: Cigarettes    Start  date: 11/25/2006   Smokeless tobacco: Never   Tobacco comments:     3cigarettes a day  Vaping Use   Vaping Use: Never used  Substance Use Topics   Alcohol use: No   Drug use: No    Review of Systems Constitutional: Negative for fever. Cardiovascular: Negative for chest pain. Respiratory: Negative for shortness of breath. Musculoskeletal: Positive for left foot and ankle pain Skin: Negative for open wounds or lesions Neurological: Negative for decrease in sensation  ____________________________________________   PHYSICAL  EXAM:  VITAL SIGNS: ED Triage Vitals  Enc Vitals Group     BP 05/22/21 2030 (!) 147/87     Pulse Rate 05/22/21 2030 80     Resp 05/22/21 2030 18     Temp 05/22/21 2030 98 F (36.7 C)     Temp Source 05/22/21 2030 Oral     SpO2 05/22/21 2030 100 %     Weight 05/22/21 2031 274 lb 14.6 oz (124.7 kg)     Height 05/22/21 2031 5\' 4"  (1.626 m)     Head Circumference --      Peak Flow --      Pain Score 05/22/21 2030 7     Pain Loc --      Pain Edu? --      Excl. in GC? --     Constitutional: Alert and oriented. Well appearing and in no acute distress. Eyes: Conjunctivae are clear without discharge or drainage Head: Atraumatic Neck: Supple Respiratory: No cough. Respirations are even and unlabored. Musculoskeletal: Focal tenderness over the lateral aspect of the left foot with mild edema Neurologic: Motor and sensory function is intact Skin: No open wounds or lesions Psychiatric: Affect and behavior are appropriate.  ____________________________________________   LABS (all labs ordered are listed, but only abnormal results are displayed)  Labs Reviewed - No data to display ____________________________________________  RADIOLOGY  Nondisplaced fracture at the base of the fifth metatarsal  I, Keller Bounds, personally viewed and evaluated these images (plain radiographs) as part of my medical decision making, as well as reviewing the written report by the radiologist.  DG Ankle 2 Views Left  Result Date: 05/22/2021 CLINICAL DATA:  Fall EXAM: LEFT ANKLE - 2 VIEW COMPARISON:  02/03/2012 FINDINGS: No malalignment. Possible cortical avulsion off the lateral fibular malleolar tip. Mild soft tissue swelling. Acute nondisplaced fracture at the base of the fifth metatarsal. IMPRESSION: 1. Acute nondisplaced fracture base of fifth metatarsal 2. Possible cortical avulsion off the tip of the fibular malleolus Electronically Signed   By: 04/04/2012 M.D.   On: 05/22/2021 21:23    ____________________________________________   PROCEDURES  .Ortho Injury Treatment  Date/Time: 05/22/2021 9:37 PM Performed by: 05/24/2021, FNP Authorized by: Chinita Pester, FNP   Consent:    Consent obtained:  Verbal   Consent given by:  Patient   Risks discussed:  StiffnessInjury location: foot Location details: left foot Injury type: fracture Fracture type: fifth metatarsal Pre-procedure neurovascular assessment: neurovascularly intact Pre-procedure distal perfusion: normal Pre-procedure neurological function: normal Pre-procedure range of motion: reduced Manipulation performed: no Supplies used: CAM boot. Post-procedure neurovascular assessment: post-procedure neurovascularly intact Post-procedure distal perfusion: normal Post-procedure neurological function: normal Post-procedure range of motion: unchanged    ____________________________________________   INITIAL IMPRESSION / ASSESSMENT AND PLAN / ED COURSE  Pamela Benjamin is a 30 y.o. who presents to the emergency department for treatment and evaluation of left foot and ankle pain after inversion injury prior to arrival.  See  HPI for further details.  Patient instructed to follow-up with podiatry.  She was also instructed to return to the emergency department for symptoms that change or worsen if unable schedule an appointment with orthopedics or primary care.  Medications  HYDROcodone-acetaminophen (NORCO/VICODIN) 5-325 MG per tablet 1 tablet (has no administration in time range)  ibuprofen (ADVIL) tablet 800 mg (800 mg Oral Given 05/22/21 2100)    Pertinent labs & imaging results that were available during my care of the patient were reviewed by me and considered in my medical decision making (see chart for details).   _________________________________________   FINAL CLINICAL IMPRESSION(S) / ED DIAGNOSES  Final diagnoses:  Closed nondisplaced fracture of fifth metatarsal bone of left foot,  initial encounter    ED Discharge Orders          Ordered    HYDROcodone-acetaminophen (NORCO/VICODIN) 5-325 MG tablet  Every 6 hours PRN        05/22/21 2144    meloxicam (MOBIC) 15 MG tablet  Daily        05/22/21 2144             If controlled substance prescribed during this visit, 12 month history viewed on the [facility] prior to issuing an initial prescription for Schedule II or III opiod.    Chinita Pester, FNP 05/22/21 2145    Delton Prairie, MD 05/22/21 [phone]

## 2021-05-22 NOTE — Discharge Instructions (Addendum)
Ice 20 minutes/h while awake.  Rest and elevate often throughout the day.  Follow-up with podiatry.   Return to the emergency department for symptoms of change or worsen if you are unable to see your primary care provider or the specialist.

## 2021-05-22 NOTE — ED Triage Notes (Addendum)
Mechanical Fall , accidental , denies hitting head or passing out.   Main complaint left ankle pain.

## 2021-05-23 LAB — POC URINE PREG, ED: Preg Test, Ur: NEGATIVE

## 2021-07-25 DIAGNOSIS — K8020 Calculus of gallbladder without cholecystitis without obstruction: Secondary | ICD-10-CM | POA: Insufficient documentation

## 2022-03-06 ENCOUNTER — Ambulatory Visit
Admit: 2022-03-06 | Discharge: 2022-03-06 | Payer: PRIVATE HEALTH INSURANCE | Attending: Family Medicine | Primary: Family Medicine

## 2022-03-06 DIAGNOSIS — Z0000 Encounter for general adult medical examination without abnormal findings: Secondary | ICD-10-CM

## 2022-03-06 NOTE — Progress Notes (Signed)
HPI:  The patient is a 31 y.o. female who presents today to establish care.   [facility] [place]    Patient presenting today for routine physical.    [facility]  She is taking zoloft 50 mg daily.  She is doing well on this medication.      Past Medical History:   Diagnosis Date    Irregular heart beat       Past Surgical History:   Procedure Laterality Date    CESAREAN SECTION      GALLBLADDER SURGERY        Family History   Adopted: Yes   Problem Relation Age of Onset    No Known Problems Mother     Cancer Father     No Known Problems Brother     No Known Problems Brother     No Known Problems Brother     No Known Problems Maternal Grandmother     No Known Problems Maternal Grandfather     No Known Problems Paternal Grandmother     No Known Problems Paternal Grandfather     No Known Problems Daughter      Social History     Socioeconomic History    Marital status: Single     Spouse name: Not on file    Number of children: 1    Years of education: Not on file    Highest education level: Not on file   Occupational History    Not on file   Tobacco Use    Smoking status: Every Day     Packs/day: 1.00     Years: 15.00     Pack years: 15.00     Types: Cigarettes     Start date: 02/23/2009    Smokeless tobacco: Never   Vaping Use    Vaping Use: Never used   Substance and Sexual Activity    Alcohol use: Never    Drug use: Never    Sexual activity: Not Currently   Other Topics Concern    Not on file   Social History Narrative    Not on file     Social Determinants of Health     Financial Resource Strain: Low Risk     Difficulty of Paying Living Expenses: Not hard at all   Food Insecurity: No Food Insecurity    Worried About Programme researcher, broadcasting/film/videounning Out of Food in the Last Year: Never true    Ran Out of Food in the Last Year: Never true   Transportation Needs: Unknown    Lack of Transportation (Medical): Not on file    Lack of Transportation (Non-Medical): No   Physical Activity: Not on  file   Stress: Not on file   Social Connections: Not on file   Intimate Partner Violence: Not on file   Housing Stability: Unknown    Unable to Pay for Housing in the Last Year: Not on file    Number of Places Lived in the Last Year: Not on file    Unstable Housing in the Last Year: No      Allergies   Allergen Reactions    Latex Rash     Breaks out  Breaks out  Breaks out  Breaks out      Bee Pollen      Other reaction(s): Other (See Comments)    Pollen Extract Other (See Comments)        Review of Systems:  Constitutional:  No fever, no fatigue, no chills, no headaches, no weight change  Dermatology:  No rash, no mole, no dry or sensitive skin  ENT:  No cough, no sore throat, no sinus pain, no runny nose, no ear pain  Cardiology:  No chest pain, no palpitations, no leg edema, no shortness of breath, no PND  Endocrinology:  No polydipsia, no polyuria, no cold intolerance, no heat intolerance, no polyphagia, no hair changes  Gastroenterology:  No dysphagia, no abdominal pain, no nausea, no vomiting, no constipation, no diarrhea, no heartburn  Female Reproductive:  No hot flashes, no abnormal vaginal discharge, no pain with menstruation, no pelvic pain  Musculoskeletal:  No joint pain, no leg cramps, no back pain, no muscle aches  Respiratory:  No shortness of breath, no orthopnea, no wheezing, no DOE, no hemoptysis  Urology:  No blood in the urine, no urinary frequency, no urinary incontinence, no urinary urgency, no nocturia, no dysuria  Neurology:  No numbness/tingling, no dizziness, no weakness  Psychology:  + depression, no sleep disturbances, no suicidal ideation, no anxiety      Vitals:    03/06/22 1446   BP: 126/88   Pulse: 88   Resp: 18   Temp: 97.9 F (36.6 C)   SpO2: 97%   Weight: (!) 311 lb (141.1 kg)   Height: 5' 2.5" (1.588 m)       Physical Exam  Vitals and nursing note reviewed.   Constitutional:       Appearance: She is well-developed. She is obese.   HENT:      Head: Normocephalic and atraumatic.       Right Ear: External ear normal.      Left Ear: External ear normal.      Nose: Nose normal.   Eyes:      Conjunctiva/sclera: Conjunctivae normal.      Pupils: Pupils are equal, round, and reactive to light.   Neck:      Thyroid: No thyromegaly.   Cardiovascular:      Rate and Rhythm: Normal rate and regular rhythm.      Heart sounds: Normal heart sounds.   Pulmonary:      Effort: Pulmonary effort is normal.      Breath sounds: Normal breath sounds. No wheezing.   Abdominal:      General: Bowel sounds are normal.      Palpations: Abdomen is soft.      Tenderness: There is no abdominal tenderness.   Musculoskeletal:         General: Normal range of motion.      Cervical back: Normal range of motion and neck supple.   Skin:     General: Skin is warm and dry.      Findings: No rash.   Neurological:      Mental Status: She is alert and oriented to person, place, and time.      Deep Tendon Reflexes: Reflexes are normal and symmetric.   Psychiatric:         Behavior: Behavior normal.       Assessment/Plan:      Layni was seen today for new patient.    Diagnoses and all orders for this visit:    Encounter for medical examination to establish care    Encounter for routine history and physical examination of adult  -     Comprehensive Metabolic Panel; Future  -     CBC with Auto Differential; Future  -  Lipid Panel; Future    Screening for diabetes mellitus  -     Comprehensive Metabolic Panel; Future    Screening, lipid  -     Lipid Panel; Future      As above.  Call or go to ED immediately if symptoms worsen or persist.  Return in about 1 year (around 03/07/2023) for physical., or sooner if necessary.      Educational materials and/or home exercises printed for patient's review and were included in patient instructions on his/her After Visit Summary and given to patient at the end of visit.      Counseled regarding above diagnosis, including possible risks and complications,  especially if left  uncontrolled.    Counseled regarding the possible side effects, risks, benefits and alternatives to treatment; patient and/or guardian verbalizes understanding, agrees, feels comfortable with and wishes to proceed with above treatment plan.    Advised patient to call with any new medication issues, and read all Rx info from pharmacy to assure aware of all possible risks and side effects of medication before taking.    Reviewed age and gender appropriate health screening exams and vaccinations.  Advised patient regarding importance of keeping up with recommended health maintenance and to schedule as soon as possible if overdue, as this is important in assessing for undiagnosed pathology, especially cancer, as well as protecting against potentially harmful/life threatening disease.        Patient and/or guardian verbalizes understanding and agrees with above counseling, assessment and plan.    All questions answered.    Voncille Lo, DO  03/06/2022    I have personally reviewed and updated the chief complaint, HPI, Past Medical, Family and Social History, as well as the above Review of Systems.

## 2022-03-19 ENCOUNTER — Inpatient Hospital Stay: Payer: PRIVATE HEALTH INSURANCE | Primary: Family Medicine

## 2022-03-19 DIAGNOSIS — Z0000 Encounter for general adult medical examination without abnormal findings: Secondary | ICD-10-CM

## 2022-03-19 LAB — COMPREHENSIVE METABOLIC PANEL
ALT: 18 U/L (ref 0–32)
AST: 16 U/L (ref 0–31)
Albumin: 4.4 g/dL (ref 3.5–5.2)
Alkaline Phosphatase: 105 U/L — ABNORMAL HIGH (ref 35–104)
Anion Gap: 15 mmol/L (ref 7–16)
BUN: 14 mg/dL (ref 6–20)
CO2: 18 mmol/L — ABNORMAL LOW (ref 22–29)
Calcium: 9.5 mg/dL (ref 8.6–10.2)
Chloride: 106 mmol/L (ref 98–107)
Creatinine: 0.8 mg/dL (ref 0.5–1.0)
Est, Glom Filt Rate: >60 mL/min/{1.73_m2} (ref >=60)
Glucose: 92 mg/dL (ref 74–99)
Potassium: 3.9 mmol/L (ref 3.5–5.0)
Sodium: 139 mmol/L (ref 132–146)
Total Bilirubin: 0.4 mg/dL (ref 0.0–1.2)
Total Protein: 7.3 g/dL (ref 6.4–8.3)

## 2022-03-19 LAB — CBC WITH AUTO DIFFERENTIAL
Basophils %: 0.7 % (ref 0.0–2.0)
Basophils Absolute: 0.05 E9/L (ref 0.00–0.20)
Eosinophils %: 4.9 % (ref 0.0–6.0)
Eosinophils Absolute: 0.35 E9/L (ref 0.05–0.50)
Hematocrit: 44.3 % (ref 34.0–48.0)
Hemoglobin: 14.7 g/dL (ref 11.5–15.5)
Immature Granulocytes #: 0.01 E9/L
Immature Granulocytes %: 0.1 % (ref 0.0–5.0)
Lymphocytes %: 31.2 % (ref 20.0–42.0)
Lymphocytes Absolute: 2.24 E9/L (ref 1.50–4.00)
MCH: 31.3 pg (ref 26.0–35.0)
MCHC: 33.2 % (ref 32.0–34.5)
MCV: 94.5 fL (ref 80.0–99.9)
MPV: 9.5 fL (ref 7.0–12.0)
Monocytes %: 6.1 % (ref 2.0–12.0)
Monocytes Absolute: 0.44 E9/L (ref 0.10–0.95)
Neutrophils %: 57 % (ref 43.0–80.0)
Neutrophils Absolute: 4.09 E9/L (ref 1.80–7.30)
Platelets: 306 E9/L (ref 130–450)
RBC: 4.69 E12/L (ref 3.50–5.50)
RDW: 12.3 fL (ref 11.5–15.0)
WBC: 7.2 E9/L (ref 4.5–11.5)

## 2022-03-19 LAB — HEMOGLOBIN A1C: Hemoglobin A1C: 4.6 % (ref 4.0–5.6)

## 2022-03-19 LAB — EKG 12-LEAD
Atrial Rate: 77 {beats}/min
P Axis: 53 degrees
P-R Interval: 178 ms
Q-T Interval: 364 ms
QRS Duration: 84 ms
QTc Calculation (Bazett): 411 ms
R Axis: 36 degrees
T Axis: 26 degrees
Ventricular Rate: 77 {beats}/min

## 2022-03-19 LAB — LIPID PANEL
Cholesterol, Total: 174 mg/dL (ref 0–199)
HDL: 53 mg/dL (ref >40)
LDL Calculated: 108 mg/dL — ABNORMAL HIGH (ref 0–99)
Triglycerides: 64 mg/dL (ref 0–149)
VLDL Cholesterol Calculated: 13 mg/dL

## 2022-03-19 LAB — VITAMIN D 25 HYDROXY: Vit D, 25-Hydroxy: 23 ng/mL — ABNORMAL LOW (ref 30–100)

## 2022-03-19 LAB — T3, FREE: T3, Free: 3.4 pg/mL (ref 2.0–4.4)

## 2022-03-19 LAB — VITAMIN B12: Vitamin B-12: 367 pg/mL (ref 211–946)

## 2022-03-19 LAB — HCG, SERUM, QUALITATIVE: hCG Qual: NEGATIVE

## 2022-03-19 LAB — T4, FREE: T4 Free: 1.15 ng/dL (ref 0.93–1.70)

## 2022-03-19 LAB — TSH: TSH: 3.21 u[IU]/mL (ref 0.270–4.200)

## 2022-09-16 ENCOUNTER — Telehealth: Payer: Medicaid Other | Admitting: Physician Assistant

## 2022-09-16 DIAGNOSIS — H60393 Other infective otitis externa, bilateral: Secondary | ICD-10-CM | POA: Diagnosis not present

## 2022-09-17 MED ORDER — NEOMYCIN-POLYMYXIN-HC 3.5-10000-1 OT SOLN: 4.0000 [drp] | Freq: Four times a day (QID) | OTIC | 0 refills | Status: AC

## 2022-09-17 MED ORDER — SULFAMETHOXAZOLE-TRIMETHOPRIM 800-160 MG PO TABS: 1.0000 | ORAL_TABLET | Freq: Two times a day (BID) | ORAL | 0 refills | Status: AC

## 2022-09-17 NOTE — Progress Notes (Signed)
I have spent 5 minutes in review of e-visit questionnaire, review and updating patient chart, medical decision making and response to patient.   Fransisco Messmer Cody Shivaan Tierno, PA-C    

## 2022-09-17 NOTE — Progress Notes (Signed)
E Visit for Swimmer's Ear  We are sorry that you are not feeling well. Here is how we plan to help!  I have prescribed: Neomycin 0.35%, polymyxin B 10,000 units/mL, and hydrocortisone 0,5% otic solution 4 drops in affected ears four times a day for 7 days  Based on what you have told me you may have a bacterial infection. In addition to the ear drops I have prescribed an oral antibiotic: and Bactrim 800mg /160mg  one tablet by mouth twice a day for 10 days  In certain cases swimmer's ear may progress to a more serious bacterial infection of the middle or inner ear.  If you have a fever 102 and up and significantly worsening symptoms, this could indicate a more serious infection moving to the middle/inner and needs face to face evaluation in an office by a provider.  Your symptoms should improve over the next 3 days and should resolve in about 7 days.  HOME CARE:  Wash your hands frequently. Do not place the tip of the bottle on your ear or touch it with your fingers. You can take Acetominophen 650 mg every 4-6 hours as needed for pain.  If pain is severe or moderate, you can apply a heating pad (set on low) or hot water bottle (wrapped in a towel) to outer ear for 20 minutes.  This will also increase drainage. Avoid ear plugs Do not use Q-tips After showers, help the water run out by tilting your head to one side.  GET HELP RIGHT AWAY IF:  Fever is over 102.2 degrees. You develop progressive ear pain or hearing loss. Ear symptoms persist longer than 3 days after treatment.  MAKE SURE YOU:  Understand these instructions. Will watch your condition. Will get help right away if you are not doing well or get worse.  TO PREVENT SWIMMER'S EAR: Use a bathing cap or custom fitted swim molds to keep your ears dry. Towel off after swimming to dry your ears. Tilt your head or pull your earlobes to allow the water to escape your ear canal. If there is still water in your ears, consider using a  hairdryer on the lowest setting.   Thank you for choosing an e-visit.  Your e-visit answers were reviewed by a board certified advanced clinical practitioner to complete your personal care plan. Depending upon the condition, your plan could have included both over the counter or prescription medications.  Please review your pharmacy choice. Make sure the pharmacy is open so you can pick up prescription now. If there is a problem, you may contact your provider through [employer] and have the prescription routed to another pharmacy.  Your safety is important to [place]. If you have drug allergies check your prescription carefully.   For the next 24 hours you can use MyChart to ask questions about today's visit, request a non-urgent call back, or ask for a work or school excuse. You will get an email in the next two days asking about your experience. I hope that your e-visit has been valuable and will speed your recovery.

## 2022-11-12 ENCOUNTER — Telehealth: Payer: Medicaid Other | Admitting: Nurse Practitioner

## 2022-11-12 DIAGNOSIS — J069 Acute upper respiratory infection, unspecified: Secondary | ICD-10-CM | POA: Diagnosis not present

## 2022-11-12 MED ORDER — FLUTICASONE PROPIONATE 50 MCG/ACT NA SUSP: 2.0000 | Freq: Every day | NASAL | 6 refills | Status: AC

## 2022-11-12 MED ORDER — BENZONATATE 100 MG PO CAPS: 100.0000 mg | ORAL_CAPSULE | Freq: Three times a day (TID) | ORAL | 0 refills | Status: AC | PRN

## 2022-11-12 NOTE — Progress Notes (Signed)
E-Visit for Upper Respiratory Infection   We are sorry you are not feeling well.  Here is how we plan to help!  Based on what you have shared with me, it looks like you may have a viral upper respiratory infection.  Upper respiratory infections are caused by a large number of viruses; however, rhinovirus is the most common cause. RSV is also a virus, and since you had close exposure this is likely the virus causing your symptoms.   Symptoms vary from person to person, with common symptoms including sore throat, cough, fatigue or lack of energy and feeling of general discomfort.  A low-grade fever of up to 100.4 may present, but is often uncommon.  Symptoms vary however, and are closely related to a person's age or underlying illnesses.  The most common symptoms associated with an upper respiratory infection are nasal discharge or congestion, cough, sneezing, headache and pressure in the ears and face.  These symptoms usually persist for about 3 to 10 days, but can last up to 2 weeks.  It is important to know that upper respiratory infections do not cause serious illness or complications in most cases.    Upper respiratory infections can be transmitted from person to person, with the most common method of transmission being a person's hands.  The virus is able to live on the skin and can infect other persons for up to 2 hours after direct contact.  Also, these can be transmitted when someone coughs or sneezes; thus, it is important to cover the mouth to reduce this risk.  To keep the spread of the illness at [place], good hand hygiene is very important.  This is an infection that is most likely caused by a virus. There are no specific treatments other than to help you with the symptoms until the infection runs its course.  We are sorry you are not feeling well.  Here is how we plan to help!   For nasal congestion, you may use an oral decongestants such as Mucinex D or if you have glaucoma or high blood  pressure use plain Mucinex.  Saline nasal spray or nasal drops can help and can safely be used as often as needed for congestion.  For your congestion, I have prescribed Fluticasone nasal spray one spray in each nostril twice a day  If you do not have a history of heart disease, hypertension, diabetes or thyroid disease, prostate/bladder issues or glaucoma, you may also use Sudafed to treat nasal congestion.  It is highly recommended that you consult with a pharmacist or your primary care physician to ensure this medication is safe for you to take.     If you have a cough, you may use cough suppressants such as Delsym and Robitussin.  If you have glaucoma or high blood pressure, you can also use Coricidin HBP.   For cough I have prescribed for you A prescription cough medication called Tessalon Perles 100 mg. You may take 1-2 capsules every 8 hours as needed for cough  If you have a sore or scratchy throat, use a saltwater gargle-  to  teaspoon of salt dissolved in a 4-ounce to 8-ounce glass of warm water.  Gargle the solution for approximately 15-30 seconds and then spit.  It is important not to swallow the solution.  You can also use throat lozenges/cough drops and Chloraseptic spray to help with throat pain or discomfort.  Warm or cold liquids can also be helpful in relieving throat pain.  For headache, pain or general discomfort, you can use Ibuprofen or Tylenol as directed.   Some authorities believe that zinc sprays or the use of Echinacea may shorten the course of your symptoms.   HOME CARE Only take medications as instructed by your medical team. Be sure to drink plenty of fluids. Water is fine as well as fruit juices, sodas and electrolyte beverages. You may want to stay away from caffeine or alcohol. If you are nauseated, try taking small sips of liquids. How do you know if you are getting enough fluid? Your urine should be a pale yellow or almost colorless. Get rest. Taking a steamy  shower or using a humidifier may help nasal congestion and ease sore throat pain. You can place a towel over your head and breathe in the steam from hot water coming from a faucet. Using a saline nasal spray works much the same way. Cough drops, hard candies and sore throat lozenges may ease your cough. Avoid close contacts especially the very young and the elderly Cover your mouth if you cough or sneeze Always remember to wash your hands.   GET HELP RIGHT AWAY IF: You develop worsening fever. If your symptoms do not improve within 10 days You develop yellow or green discharge from your nose over 3 days. You have coughing fits You develop a severe head ache or visual changes. You develop shortness of breath, difficulty breathing or start having chest pain Your symptoms persist after you have completed your treatment plan  MAKE SURE YOU  Understand these instructions. Will watch your condition. Will get help right away if you are not doing well or get worse.  Thank you for choosing an e-visit.  Your e-visit answers were reviewed by a board certified advanced clinical practitioner to complete your personal care plan. Depending upon the condition, your plan could have included both over the counter or prescription medications.  Please review your pharmacy choice. Make sure the pharmacy is open so you can pick up prescription now. If there is a problem, you may contact your provider through [employer] and have the prescription routed to another pharmacy.  Your safety is important to [place]. If you have drug allergies check your prescription carefully.   For the next 24 hours you can use MyChart to ask questions about today's visit, request a non-urgent call back, or ask for a work or school excuse. You will get an email in the next two days asking about your experience. I hope that your e-visit has been valuable and will speed your recovery.  Meds ordered this encounter  Medications    benzonatate (TESSALON) 100 MG capsule    Sig: Take 1 capsule (100 mg total) by mouth 3 (three) times daily as needed.    Dispense:  30 capsule    Refill:  0   fluticasone (FLONASE) 50 MCG/ACT nasal spray    Sig: Place 2 sprays into both nostrils daily.    Dispense:  16 g    Refill:  6     I spent approximately 5 minutes reviewing the patient's history, current symptoms and coordinating their care today.

## 2023-03-10 ENCOUNTER — Encounter: Payer: PRIVATE HEALTH INSURANCE | Attending: Family Medicine | Primary: Family Medicine

## 2023-05-09 DIAGNOSIS — F331 Major depressive disorder, recurrent, moderate: Secondary | ICD-10-CM | POA: Diagnosis not present

## 2023-05-21 DIAGNOSIS — F339 Major depressive disorder, recurrent, unspecified: Secondary | ICD-10-CM | POA: Diagnosis not present

## 2023-05-21 DIAGNOSIS — G4700 Insomnia, unspecified: Secondary | ICD-10-CM | POA: Diagnosis not present

## 2024-07-30 ENCOUNTER — Other Ambulatory Visit: Payer: Self-pay | Admitting: Medical Genetics

## 2024-08-03 ENCOUNTER — Other Ambulatory Visit: Payer: MEDICAID

## 2024-10-27 ENCOUNTER — Ambulatory Visit: Payer: MEDICAID

## 2024-12-09 ENCOUNTER — Other Ambulatory Visit: Payer: MEDICAID

## 2024-12-23 ENCOUNTER — Other Ambulatory Visit: Payer: MEDICAID

## 2025-01-03 ENCOUNTER — Other Ambulatory Visit: Payer: MEDICAID
